# Patient Record
Sex: Female | Born: 1965 | Race: White | Hispanic: Yes | Marital: Married | Smoking: Never smoker
Health system: Southern US, Community
[De-identification: ages and names within clinical notes are randomized; demographics above are authoritative.]

## PROBLEM LIST (undated history)

## (undated) DIAGNOSIS — R630 Anorexia: Secondary | ICD-10-CM

## (undated) DIAGNOSIS — N200 Calculus of kidney: Secondary | ICD-10-CM

## (undated) HISTORY — PX: OTHER SURGICAL HISTORY: SHX169

## (undated) HISTORY — DX: Anorexia: R63.0

## (undated) HISTORY — DX: Calculus of kidney: N20.0

---

## 2003-11-08 ENCOUNTER — Ambulatory Visit: Payer: Self-pay | Admitting: Internal Medicine

## 2003-11-19 ENCOUNTER — Encounter: Admission: RE | Admit: 2003-11-19 | Discharge: 2003-11-19 | Payer: Self-pay | Admitting: Obstetrics & Gynecology

## 2004-11-06 ENCOUNTER — Ambulatory Visit: Payer: Self-pay | Admitting: Internal Medicine

## 2005-05-22 ENCOUNTER — Ambulatory Visit: Payer: Self-pay | Admitting: Internal Medicine

## 2006-03-02 ENCOUNTER — Ambulatory Visit: Payer: Self-pay | Admitting: Internal Medicine

## 2008-03-14 ENCOUNTER — Ambulatory Visit: Payer: Self-pay | Admitting: Internal Medicine

## 2008-03-14 LAB — CONVERTED CEMR LAB: Rapid Strep: NEGATIVE

## 2008-03-20 ENCOUNTER — Telehealth (INDEPENDENT_AMBULATORY_CARE_PROVIDER_SITE_OTHER): Payer: Self-pay | Admitting: *Deleted

## 2008-09-04 ENCOUNTER — Ambulatory Visit: Payer: Self-pay | Admitting: Internal Medicine

## 2008-12-20 ENCOUNTER — Telehealth: Payer: Self-pay | Admitting: Family Medicine

## 2009-07-22 ENCOUNTER — Encounter: Admission: RE | Admit: 2009-07-22 | Discharge: 2009-07-22 | Payer: Self-pay | Admitting: Obstetrics & Gynecology

## 2010-09-29 ENCOUNTER — Ambulatory Visit: Payer: Self-pay | Admitting: Internal Medicine

## 2010-10-07 ENCOUNTER — Ambulatory Visit (INDEPENDENT_AMBULATORY_CARE_PROVIDER_SITE_OTHER): Payer: BC Managed Care – PPO | Admitting: Internal Medicine

## 2010-10-07 ENCOUNTER — Encounter: Payer: Self-pay | Admitting: Internal Medicine

## 2010-10-07 DIAGNOSIS — Z0000 Encounter for general adult medical examination without abnormal findings: Secondary | ICD-10-CM | POA: Insufficient documentation

## 2010-10-07 DIAGNOSIS — Z111 Encounter for screening for respiratory tuberculosis: Secondary | ICD-10-CM

## 2010-10-07 NOTE — Assessment & Plan Note (Addendum)
Td -- 2006 cscope  ~ 1995 per pt , normal Gyn-- sees them regularly Labs-- just likes cholesterol checked, declined other labs  Doing very well, I recommend to continue with her healthy lifestyle. Form completed , will come back in 2 days for PPD reading (she will hold on to the form )

## 2010-10-07 NOTE — Progress Notes (Signed)
  Subjective:    Patient ID: Alyssa Morton, female    DOB: 05-27-1965, 45 y.o.   MRN: [id number]  HPI Next physical exam, doing well  Past Medical History  Diagnosis Date  . Anorexia     IN HIGH SCHOOL  . Kidney stones     HX OF   Past Surgical History  Procedure Date  . G2 p2    History   Social History  . Marital Status: Married    Spouse Name: N/A    Number of Children: 2  . Years of Education: N/A   Occupational History  . elementary teacher     Social History Main Topics  . Smoking status: Never Smoker   . Smokeless tobacco: Never Used  . Alcohol Use: Yes     rarely   . Drug Use: No  . Sexually Active: Not on file   Other Topics Concern  . Not on file   Social History Narrative   Diet: healthy-- exercise: routinely    Family History  Problem Relation Age of Onset  . Hypertension Maternal Grandfather   . Diabetes Neg Hx   . Coronary artery disease Neg Hx   . Colon cancer Neg Hx   . Breast cancer Neg Hx       Review of Systems  Respiratory: Negative for cough and shortness of breath.   Cardiovascular: Negative for chest pain and leg swelling.  Gastrointestinal: Negative for abdominal pain and blood in stool.  Genitourinary: Negative for dysuria and hematuria.       Objective:   Physical Exam  Constitutional: She is oriented to person, place, and time. She appears well-developed and well-nourished. No distress.  HENT:  Head: Normocephalic and atraumatic.  Neck: No thyromegaly present.  Cardiovascular: Normal rate, regular rhythm and normal heart sounds.   No murmur heard. Pulmonary/Chest: Effort normal and breath sounds normal. No respiratory distress. She has no wheezes. She has no rales.  Musculoskeletal: She exhibits no edema.  Neurological: She is alert and oriented to person, place, and time.  Skin: She is not diaphoretic.  Psychiatric: She has a normal mood and affect. Her behavior is normal. Judgment and thought content normal.           Assessment & Plan:

## 2010-10-07 NOTE — Patient Instructions (Signed)
Please came back fasting for  1. PPD reading 2. Labs : FLP--dx v70   (PT WILL WALKING FOR LABS AND PPD)

## 2010-10-10 ENCOUNTER — Other Ambulatory Visit (INDEPENDENT_AMBULATORY_CARE_PROVIDER_SITE_OTHER): Payer: BC Managed Care – PPO

## 2010-10-10 DIAGNOSIS — E785 Hyperlipidemia, unspecified: Secondary | ICD-10-CM

## 2010-10-10 LAB — LIPID PANEL
Cholesterol: 153 mg/dL (ref 0–200)
HDL: 60.6 mg/dL (ref >39.00)
LDL Cholesterol: 82 mg/dL (ref 0–99)
Total CHOL/HDL Ratio: 3
Triglycerides: 50 mg/dL (ref 0.0–149.0)
VLDL: 10 mg/dL (ref 0.0–40.0)

## 2010-10-10 LAB — TB SKIN TEST
Induration: 0
TB Skin Test: NEGATIVE mm

## 2011-06-09 ENCOUNTER — Other Ambulatory Visit: Payer: Self-pay | Admitting: Obstetrics & Gynecology

## 2011-06-09 DIAGNOSIS — Z1231 Encounter for screening mammogram for malignant neoplasm of breast: Secondary | ICD-10-CM

## 2011-06-26 ENCOUNTER — Ambulatory Visit
Admission: RE | Admit: 2011-06-26 | Discharge: 2011-06-26 | Disposition: A | Discharge status: Home / Self Care | Payer: BC Managed Care – PPO | Source: Ambulatory Visit | Attending: Obstetrics & Gynecology | Admitting: Obstetrics & Gynecology

## 2011-06-26 DIAGNOSIS — Z1231 Encounter for screening mammogram for malignant neoplasm of breast: Secondary | ICD-10-CM

## 2011-07-01 ENCOUNTER — Ambulatory Visit: Payer: BC Managed Care – PPO

## 2012-09-13 ENCOUNTER — Other Ambulatory Visit: Payer: Self-pay

## 2012-09-13 DIAGNOSIS — Z1231 Encounter for screening mammogram for malignant neoplasm of breast: Secondary | ICD-10-CM

## 2012-11-02 ENCOUNTER — Ambulatory Visit
Admission: RE | Admit: 2012-11-02 | Discharge: 2012-11-02 | Disposition: A | Discharge status: Home / Self Care | Payer: BC Managed Care – PPO | Source: Ambulatory Visit

## 2012-11-02 DIAGNOSIS — Z1231 Encounter for screening mammogram for malignant neoplasm of breast: Secondary | ICD-10-CM

## 2013-12-18 ENCOUNTER — Other Ambulatory Visit: Payer: Self-pay

## 2013-12-18 ENCOUNTER — Other Ambulatory Visit: Payer: Self-pay | Admitting: Obstetrics & Gynecology

## 2013-12-18 DIAGNOSIS — Z1231 Encounter for screening mammogram for malignant neoplasm of breast: Secondary | ICD-10-CM

## 2013-12-25 ENCOUNTER — Ambulatory Visit
Admission: RE | Admit: 2013-12-25 | Discharge: 2013-12-25 | Disposition: A | Discharge status: Home / Self Care | Payer: 59 | Source: Ambulatory Visit

## 2013-12-25 DIAGNOSIS — Z1231 Encounter for screening mammogram for malignant neoplasm of breast: Secondary | ICD-10-CM

## 2014-07-24 DIAGNOSIS — M7661 Achilles tendinitis, right leg: Secondary | ICD-10-CM | POA: Insufficient documentation

## 2014-12-25 LAB — HM PAP SMEAR

## 2015-01-01 ENCOUNTER — Other Ambulatory Visit: Payer: Self-pay

## 2015-01-01 DIAGNOSIS — Z1231 Encounter for screening mammogram for malignant neoplasm of breast: Secondary | ICD-10-CM

## 2015-01-24 ENCOUNTER — Ambulatory Visit
Admission: RE | Admit: 2015-01-24 | Discharge: 2015-01-24 | Disposition: A | Discharge status: Home / Self Care | Payer: 59 | Source: Ambulatory Visit

## 2015-01-24 DIAGNOSIS — Z1231 Encounter for screening mammogram for malignant neoplasm of breast: Secondary | ICD-10-CM

## 2015-09-20 ENCOUNTER — Ambulatory Visit (INDEPENDENT_AMBULATORY_CARE_PROVIDER_SITE_OTHER): Payer: Managed Care, Other (non HMO) | Admitting: Family Medicine

## 2015-09-20 ENCOUNTER — Encounter: Payer: Self-pay | Admitting: Family Medicine

## 2015-09-20 VITALS — BP 90/52 | HR 62 | Temp 97.6°F | Ht 63.75 in | Wt 132.8 lb

## 2015-09-20 DIAGNOSIS — Z0289 Encounter for other administrative examinations: Secondary | ICD-10-CM | POA: Diagnosis not present

## 2015-09-20 DIAGNOSIS — Z23 Encounter for immunization: Secondary | ICD-10-CM

## 2015-09-20 NOTE — Progress Notes (Signed)
Chief Complaint  Patient presents with  . Establish Care    pt need job form filled out       New Patient Visit SUBJECTIVE: HPI: Alyssa Morton is an 50 y.o.female who is being seen for establishing care.  She needs a form filled out for work. She is available body and works with schools. Her last tetanus shot was 9 years ago. She is up-to-date with her immunizations. She receives her women's health including Pap smear and mammogram through gynecology. She has no lifting restrictions are musculoskeletal ailments. She has no cardiac or pulmonary history denies shortness of breath or chest pain. No recent travel or history of TB exposure.  No Known Allergies  Past Medical History:  Diagnosis Date  . Anorexia    IN HIGH SCHOOL  . Kidney stones    HX OF   Past Surgical History:  Procedure Laterality Date  . G2 P2     Social History   Social History  . Marital status: Married  . Number of children: 2   Occupational History  . elementary teacher     Social History Main Topics  . Smoking status: Never Smoker  . Smokeless tobacco: Never Used  . Alcohol use Yes     Comment: rarely   . Drug use: No   Social History Narrative   Diet: healthy-- exercise: routinely    Family History  Problem Relation Age of Onset  . Hypertension Maternal Grandfather   . Diabetes Neg Hx   . Coronary artery disease Neg Hx   . Colon cancer Neg Hx   . Breast cancer Neg Hx    She takes no medications routinely.  ROS Cardiovascular: Denies chest pain or pressure, palpitations  Respiratory: Denies dyspnea, cough   OBJECTIVE: BP (!) 90/52 (BP Location: Left Arm, Patient Position: Sitting, Cuff Size: Normal)   Pulse 62   Temp 97.6 F (36.4 C) (Oral)   Ht 5' 3.75" (1.619 m)   Wt 132 lb 12.8 oz (60.2 kg)   LMP 09/08/2015 (Exact Date)   SpO2 99%   BMI 22.97 kg/m   Constitutional: -  VS reviewed -  Well developed, well nourished, appears stated age -  No apparent distress  Psychiatric:  -  Oriented to person, place, and time -  Memory intact -  Affect and mood normal -  Fluent conversation, good eye contact -  Judgment and insight age appropriate  Eye: -  Conjunctivae clear, no discharge -  Pupils symmetric, round, reactive to light  ENMT: -  Ears are patent b/l without erythema or discharge. TM's are shiny and clear b/l without evidence of effusion or infection. -  Oral mucosa without lesions, tongue and uvula midline    Tonsils not enlarged, no erythema, no exudate, trachea midline    Pharynx moist, no lesions, no erythema  Neck: -  No gross swelling, no palpable masses -  Thyroid midline, not enlarged, mobile, no palpable masses  Cardiovascular: -  RRR, no murmurs -  No LE edema  Respiratory: -  Normal respiratory effort, no accessory muscle use, no retraction -  Breath sounds equal, no wheezes, no ronchi, no crackles  Gastrointestinal: -  Bowel sounds normal -  No tenderness, no distention, no guarding, no masses  Neurological:  -  CN II - XII grossly intact -  Sensation grossly intact to light touch, equal bilaterally  Musculoskeletal: -  No clubbing, no cyanosis -  Gait normal -   5/5 strength throughout  Skin: -  No significant lesion on inspection -  Warm and dry to palpation   ASSESSMENT/PLAN: Encounter for completion of form with patient  Need for tuberculosis vaccination - Plan: PPD  Form filled out. No restrictions. She will pick up the completed form on Monday when her TB test as read Patient should return in 3 days and have her TB test read. Otherwise as needed. The patient voiced understanding and agreement to the plan.   Jilda Rocheicholas Paul BouseWendling, DO 09/20/15  4:57 PM

## 2015-09-20 NOTE — Progress Notes (Signed)
Pre visit review using our clinic review tool, if applicable. No additional management support is needed unless otherwise documented below in the visit note. 

## 2015-09-25 ENCOUNTER — Ambulatory Visit (INDEPENDENT_AMBULATORY_CARE_PROVIDER_SITE_OTHER): Payer: Managed Care, Other (non HMO)

## 2015-09-25 DIAGNOSIS — Z111 Encounter for screening for respiratory tuberculosis: Secondary | ICD-10-CM | POA: Diagnosis not present

## 2015-09-25 DIAGNOSIS — Z23 Encounter for immunization: Secondary | ICD-10-CM

## 2015-09-25 MED ORDER — TUBERCULIN PPD 5 UNIT/0.1ML ID SOLN: 0.1000 mL | Freq: Once | INTRADERMAL | 0 refills | Status: AC

## 2015-09-25 NOTE — Progress Notes (Signed)
Pre visit review using our clinic review tool, if applicable. No additional management support is needed unless otherwise documented below in the visit note.  TB skin test only

## 2015-09-27 LAB — TB SKIN TEST
Induration: 0 mm
TB Skin Test: NEGATIVE

## 2015-09-27 NOTE — Addendum Note (Signed)
Addended by: Vergie LivingBECKETT, Maynard David S on: 09/27/2015 04:17 PM   Modules accepted: Orders

## 2016-01-20 ENCOUNTER — Other Ambulatory Visit: Payer: Self-pay | Admitting: Obstetrics and Gynecology

## 2016-01-20 DIAGNOSIS — Z1231 Encounter for screening mammogram for malignant neoplasm of breast: Secondary | ICD-10-CM

## 2016-02-17 ENCOUNTER — Ambulatory Visit
Admission: RE | Admit: 2016-02-17 | Discharge: 2016-02-17 | Disposition: A | Discharge status: Home / Self Care | Payer: Managed Care, Other (non HMO) | Source: Ambulatory Visit | Attending: Obstetrics and Gynecology | Admitting: Obstetrics and Gynecology

## 2016-02-17 DIAGNOSIS — Z1231 Encounter for screening mammogram for malignant neoplasm of breast: Secondary | ICD-10-CM

## 2016-04-29 DIAGNOSIS — Z6823 Body mass index (BMI) 23.0-23.9, adult: Secondary | ICD-10-CM | POA: Diagnosis not present

## 2016-04-29 DIAGNOSIS — M7541 Impingement syndrome of right shoulder: Secondary | ICD-10-CM | POA: Diagnosis not present

## 2016-04-29 DIAGNOSIS — M7531 Calcific tendinitis of right shoulder: Secondary | ICD-10-CM | POA: Diagnosis not present

## 2016-05-13 DIAGNOSIS — Z01818 Encounter for other preprocedural examination: Secondary | ICD-10-CM | POA: Diagnosis not present

## 2016-05-13 DIAGNOSIS — Z1211 Encounter for screening for malignant neoplasm of colon: Secondary | ICD-10-CM | POA: Diagnosis not present

## 2016-05-19 LAB — HM COLONOSCOPY

## 2016-06-17 ENCOUNTER — Ambulatory Visit (INDEPENDENT_AMBULATORY_CARE_PROVIDER_SITE_OTHER): Payer: BLUE CROSS/BLUE SHIELD | Admitting: Family Medicine

## 2016-06-17 ENCOUNTER — Other Ambulatory Visit: Payer: BLUE CROSS/BLUE SHIELD

## 2016-06-17 ENCOUNTER — Telehealth: Payer: Self-pay | Admitting: Family Medicine

## 2016-06-17 ENCOUNTER — Ambulatory Visit (INDEPENDENT_AMBULATORY_CARE_PROVIDER_SITE_OTHER): Payer: BLUE CROSS/BLUE SHIELD

## 2016-06-17 DIAGNOSIS — Z23 Encounter for immunization: Secondary | ICD-10-CM | POA: Diagnosis not present

## 2016-06-17 DIAGNOSIS — Z0184 Encounter for antibody response examination: Secondary | ICD-10-CM

## 2016-06-17 LAB — MEASLES/MUMPS/RUBELLA IMMUNITY
Mumps IgG: 13.9 AU/mL — ABNORMAL HIGH (ref <9.00)
Rubella: 2.37 Index — ABNORMAL HIGH (ref <0.90)
Rubeola IgG: 57.8 AU/mL — ABNORMAL HIGH (ref <25.00)

## 2016-06-17 NOTE — Progress Notes (Signed)
Pre visit review using our clinic tool,if applicable. No additional management support is needed unless otherwise documented below in the visit note.   Patient in for Tdap and MMR titer  Tdap given IM left deltoid per patient request. No complaints voiced.

## 2016-06-17 NOTE — Telephone Encounter (Signed)
Caller name: Relationship to patient: Self Can be reached: [phone]  Pharmacy:  Reason for call: Request Varicella Titer, Hep B, and TB test (2 part)

## 2016-06-17 NOTE — Telephone Encounter (Signed)
OK. Given today.

## 2016-06-18 NOTE — Telephone Encounter (Signed)
[facility] [phone]) asking the pt to RTC regarding immunizations and form.//AB/CMA

## 2016-06-22 NOTE — Addendum Note (Signed)
Addended by: Verdie ShireBAYNES, ANGELA M on: 06/22/2016 11:31 AM   Modules accepted: Orders

## 2016-06-22 NOTE — Telephone Encounter (Signed)
Called and spoke with the pt on (Friday-06/19/16) and informed her that we will need to get a Varicella Titer and Hep B titer to check for immunity status on both.  Which is blood drawn.  Pt verbalized understanding and agreed.  Informed the pt that I will need to schedule her for a lab appt for the blood draw,and a nurse appt for the TB testing.  Also explained the 2 step TB skin test to the pt and she verbalized understanding.  Pt was scheduled the lab and nurse visit for (Tues-06/30/16 @ 9:00 and 9:30am).//AB/CMA

## 2016-06-24 DIAGNOSIS — Z1211 Encounter for screening for malignant neoplasm of colon: Secondary | ICD-10-CM | POA: Diagnosis not present

## 2016-06-30 ENCOUNTER — Other Ambulatory Visit: Payer: BLUE CROSS/BLUE SHIELD

## 2016-06-30 ENCOUNTER — Ambulatory Visit: Payer: BLUE CROSS/BLUE SHIELD

## 2016-06-30 DIAGNOSIS — Z0184 Encounter for antibody response examination: Secondary | ICD-10-CM

## 2016-06-30 DIAGNOSIS — Z111 Encounter for screening for respiratory tuberculosis: Secondary | ICD-10-CM

## 2016-06-30 LAB — HEPATITIS B SURFACE ANTIBODY, QUANTITATIVE: Hep B S AB Quant (Post): <5 m[IU]/mL

## 2016-06-30 NOTE — Progress Notes (Signed)
Pre visit review using our clinic tool,if applicable. No additional management support is needed unless otherwise documented below in the visit note.   Patient in for PPD placement. Given 0.1 ml (Mantoux) ID left forearm.  Patient scheduled to return on 07/02/16 to have read.

## 2016-07-01 LAB — VARICELLA ZOSTER ANTIBODY, IGG: Varicella IgG: 637.3 Index — ABNORMAL HIGH (ref <135.00)

## 2016-07-02 ENCOUNTER — Telehealth: Payer: Self-pay | Admitting: *Deleted

## 2016-07-02 ENCOUNTER — Ambulatory Visit: Payer: BLUE CROSS/BLUE SHIELD | Admitting: Behavioral Health

## 2016-07-02 DIAGNOSIS — Z111 Encounter for screening for respiratory tuberculosis: Secondary | ICD-10-CM

## 2016-07-02 LAB — READ PPD: TB Skin Test: NEGATIVE

## 2016-07-02 NOTE — Progress Notes (Signed)
Pre visit review using our clinic review tool, if applicable. No additional management support is needed unless otherwise documented below in the visit note.  Patient came in the clinic to have PPD read within the recommended 48-72 hours. Provider's order given per telephone note 06/17/16. PPD placed on 06/30/16; see clinical support note. Today's results were negative; 0 mm induration. No chest x-ray was required. Letter given to patient for school.

## 2016-07-02 NOTE — Telephone Encounter (Signed)
-----   Message from Sharlene DoryNicholas Paul Wendling, DO sent at 07/01/2016  1:16 PM EDT ----- It appears she is immune to varicella, but not to Hep B. Would recommend Hep B vaccination series now, in 2 mo and then 6 mo from original dose. TY.

## 2016-07-02 NOTE — Telephone Encounter (Addendum)
Pt came in the office today and was given her recent lab results and note.  Pt verbalized understanding and agreed.  Need order given the okay to give the Hep B series.  Please advise.//AB/CMA

## 2016-07-03 NOTE — Telephone Encounter (Signed)
OK 

## 2016-07-07 ENCOUNTER — Ambulatory Visit (INDEPENDENT_AMBULATORY_CARE_PROVIDER_SITE_OTHER): Payer: BLUE CROSS/BLUE SHIELD

## 2016-07-07 DIAGNOSIS — Z23 Encounter for immunization: Secondary | ICD-10-CM | POA: Diagnosis not present

## 2016-07-07 DIAGNOSIS — Z111 Encounter for screening for respiratory tuberculosis: Secondary | ICD-10-CM | POA: Diagnosis not present

## 2016-07-07 NOTE — Progress Notes (Signed)
Pre visit review using our clinic tool,if applicable. No additional management support is needed unless otherwise documented below in the visit note.   Patient in for 2nd PPD and 1st Hep B immunizations per order from Dr. Carmelia RollerWendling dated 07/02/16.  Hep B given in Left deltoid. PPD given in Right forearm.Patient tolerated well Appointment scheduled for PPD read and 2nd Hep B.    Patient made aware.

## 2016-07-09 ENCOUNTER — Ambulatory Visit: Payer: BLUE CROSS/BLUE SHIELD

## 2016-07-09 DIAGNOSIS — Z111 Encounter for screening for respiratory tuberculosis: Secondary | ICD-10-CM

## 2016-07-09 LAB — TB SKIN TEST: TB Skin Test: NEGATIVE

## 2016-07-09 NOTE — Progress Notes (Signed)
Patient in for PPD read. PPD negative. Form signed and dated. Given to patient.

## 2016-07-10 ENCOUNTER — Ambulatory Visit: Payer: Self-pay

## 2016-07-16 DIAGNOSIS — Z6823 Body mass index (BMI) 23.0-23.9, adult: Secondary | ICD-10-CM | POA: Diagnosis not present

## 2016-07-16 DIAGNOSIS — M7541 Impingement syndrome of right shoulder: Secondary | ICD-10-CM | POA: Diagnosis not present

## 2016-07-21 DIAGNOSIS — N390 Urinary tract infection, site not specified: Secondary | ICD-10-CM | POA: Diagnosis not present

## 2016-07-21 DIAGNOSIS — R300 Dysuria: Secondary | ICD-10-CM | POA: Diagnosis not present

## 2016-08-14 ENCOUNTER — Encounter: Payer: Self-pay | Admitting: Family Medicine

## 2016-08-14 NOTE — Telephone Encounter (Signed)
°  Relation to pt: self  Call back number: [phone]  Reason for call:  Patient would like to discuss vaccination and could not elaborate at the time due to her being in a public place, patient received a message from school regarding immunization and would like to speak with nurse,please advise

## 2016-08-14 NOTE — Telephone Encounter (Signed)
Pt called in to request another call back from assistant before the weekend.    Please call back at : [phone]

## 2016-08-14 NOTE — Telephone Encounter (Signed)
error:[id number] ° °

## 2016-08-17 ENCOUNTER — Encounter: Payer: Self-pay | Admitting: Family Medicine

## 2016-08-17 ENCOUNTER — Ambulatory Visit (INDEPENDENT_AMBULATORY_CARE_PROVIDER_SITE_OTHER): Payer: BLUE CROSS/BLUE SHIELD | Admitting: Family Medicine

## 2016-08-17 VITALS — BP 98/64 | HR 61 | Temp 98.1°F | Ht 63.75 in | Wt 127.8 lb

## 2016-08-17 DIAGNOSIS — Z7189 Other specified counseling: Secondary | ICD-10-CM

## 2016-08-17 DIAGNOSIS — Z7185 Encounter for immunization safety counseling: Secondary | ICD-10-CM

## 2016-08-17 NOTE — Telephone Encounter (Signed)
Pt was seen in the office today.//AB/CMA

## 2016-08-17 NOTE — Progress Notes (Signed)
Chief Complaint  Patient presents with  . Pt needing vaccinations    for school.    Subjective: Patient is a 51 y.o. female here for vaccinations.  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 She hopefully starts this September. She has a form to fill out regarding her vaccinations. We did a hepatitis B titer that showed no levels of immunity. She is undergoing revaccination and received the first dose in June and her next dose is scheduled for early Sept. She feels well otherwise. She is physically active and exercises routinely.   ROS: Const: no fevers  Family History  Problem Relation Age of Onset  . Hypertension Maternal Grandfather   . Diabetes Neg Hx   . Coronary artery disease Neg Hx   . Colon cancer Neg Hx   . Breast cancer Neg Hx    Past Medical History:  Diagnosis Date  . Anorexia    IN HIGH SCHOOL  . Kidney stones    HX OF   No Known Allergies   She takes no medications routinely.  Objective: BP 98/64 (BP Location: Left Arm, Patient Position: Sitting, Cuff Size: Normal)   Pulse 61   Temp 98.1 F (36.7 C) (Oral)   Ht 5' 3.75" (1.619 m)   Wt 127 lb 12.8 oz (58 kg)   LMP 07/17/2016 (Approximate)   SpO2 99%   BMI 22.11 kg/m  General: Awake, appears stated age Lungs: No accessory muscle use Psych: Age appropriate judgment and insight, normal affect and mood  Assessment and Plan: Immunization counseling  Letter written discussing her immunizations and compliance with our recs. I do not think that her lack of immunogenicity will affect her safety or ability to participate in her clinicals.  The patient voiced understanding and agreement to the plan.  Greater than 13 minutes were spent face to face with the patient with greater than 50% of this time spent counseling on vaccination schedules and immunity.  Jilda Rocheicholas Paul [place], DO 08/17/16  2:43 PM

## 2016-08-17 NOTE — Patient Instructions (Addendum)
Let me know if you need a specific letter for your schooling.

## 2016-09-11 ENCOUNTER — Ambulatory Visit (INDEPENDENT_AMBULATORY_CARE_PROVIDER_SITE_OTHER): Payer: BLUE CROSS/BLUE SHIELD | Admitting: Behavioral Health

## 2016-09-11 DIAGNOSIS — Z23 Encounter for immunization: Secondary | ICD-10-CM

## 2016-09-11 NOTE — Progress Notes (Signed)
Pre visit review using our clinic review tool, if applicable. No additional management support is needed unless otherwise documented below in the visit note.  Patient came in clinic for 2nd Hepatitis B vaccination. IM injection was given in the left deltoid. Patient tolerated injection well. Next appointment 01/08/17 at 9:00 AM.

## 2016-10-05 DIAGNOSIS — Z23 Encounter for immunization: Secondary | ICD-10-CM | POA: Diagnosis not present

## 2016-10-16 ENCOUNTER — Ambulatory Visit: Payer: Self-pay

## 2016-11-30 DIAGNOSIS — M25511 Pain in right shoulder: Secondary | ICD-10-CM | POA: Diagnosis not present

## 2016-11-30 DIAGNOSIS — M7531 Calcific tendinitis of right shoulder: Secondary | ICD-10-CM | POA: Diagnosis not present

## 2017-01-08 ENCOUNTER — Ambulatory Visit: Payer: Self-pay

## 2017-01-12 ENCOUNTER — Ambulatory Visit (INDEPENDENT_AMBULATORY_CARE_PROVIDER_SITE_OTHER): Payer: BLUE CROSS/BLUE SHIELD

## 2017-01-12 DIAGNOSIS — Z23 Encounter for immunization: Secondary | ICD-10-CM | POA: Diagnosis not present

## 2017-03-24 DIAGNOSIS — J029 Acute pharyngitis, unspecified: Secondary | ICD-10-CM | POA: Diagnosis not present

## 2017-04-28 ENCOUNTER — Telehealth: Payer: Self-pay | Admitting: Family Medicine

## 2017-04-28 NOTE — Telephone Encounter (Signed)
f °

## 2017-05-21 ENCOUNTER — Other Ambulatory Visit: Payer: Self-pay | Admitting: Family Medicine

## 2017-05-21 DIAGNOSIS — Z1231 Encounter for screening mammogram for malignant neoplasm of breast: Secondary | ICD-10-CM

## 2017-05-24 ENCOUNTER — Encounter: Payer: Self-pay | Admitting: Family Medicine

## 2017-05-27 ENCOUNTER — Telehealth: Payer: Self-pay | Admitting: Family Medicine

## 2017-05-27 NOTE — Telephone Encounter (Signed)
Copied from CRM [id number]. Topic: Quick Communication - See Telephone Encounter >> May 27, 2017  4:28 PM Rudi Coco, NT wrote: CRM for notification. See Telephone encounter for: 05/27/17.  Pt. Calling to check the status of want ing to schedule a Hep B vacc.

## 2017-06-01 ENCOUNTER — Other Ambulatory Visit: Payer: Self-pay | Admitting: Family Medicine

## 2017-06-01 ENCOUNTER — Other Ambulatory Visit (INDEPENDENT_AMBULATORY_CARE_PROVIDER_SITE_OTHER): Payer: BLUE CROSS/BLUE SHIELD

## 2017-06-01 DIAGNOSIS — Z0184 Encounter for antibody response examination: Secondary | ICD-10-CM

## 2017-06-01 LAB — HEPATITIS B SURFACE ANTIBODY,QUALITATIVE: Hep B S Ab: REACTIVE — AB

## 2017-06-01 NOTE — Telephone Encounter (Signed)
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 She had a titer/and has had the 3 Hep B----The school is now requesting a titer

## 2017-06-01 NOTE — Telephone Encounter (Signed)
OK to order titer. TY.

## 2017-06-01 NOTE — Telephone Encounter (Signed)
Advise had already on 07/07/2016--09/11/2016---and 01/12/2017

## 2017-06-01 NOTE — Telephone Encounter (Signed)
Put in order/called the patient to schedule appointment in the lab

## 2017-06-01 NOTE — Telephone Encounter (Signed)
It is a 3 shot series. What specifically does she need?

## 2017-06-11 ENCOUNTER — Ambulatory Visit
Admission: RE | Admit: 2017-06-11 | Discharge: 2017-06-11 | Disposition: A | Discharge status: Home / Self Care | Payer: BLUE CROSS/BLUE SHIELD | Source: Ambulatory Visit | Attending: Family Medicine | Admitting: Family Medicine

## 2017-06-11 DIAGNOSIS — Z1231 Encounter for screening mammogram for malignant neoplasm of breast: Secondary | ICD-10-CM

## 2017-06-15 ENCOUNTER — Encounter: Payer: Self-pay | Admitting: Family Medicine

## 2017-06-23 ENCOUNTER — Telehealth: Payer: Self-pay | Admitting: *Deleted

## 2017-06-23 DIAGNOSIS — Z0184 Encounter for antibody response examination: Secondary | ICD-10-CM

## 2017-06-23 DIAGNOSIS — Z111 Encounter for screening for respiratory tuberculosis: Secondary | ICD-10-CM

## 2017-06-23 NOTE — Telephone Encounter (Signed)
Patient called and she needs for her paperwork a numerical value for Hep B and back in May we did only yes or no.  So it looks like she needs a quant (shows numerical value).  She also needing TB test, which she can do with blood work.    She does not want to have to do another test for hep B because she thought the one in May was the right one and she should not have to pay for another test.  This is the third test in a year.    I have pended the tests that she needs, but is there anyway to help her out with the hep b since the wrong test was ordered.

## 2017-06-23 NOTE — Telephone Encounter (Signed)
Orders placed. Looks like first one was not accepted because she needed a quant, not just documentation of immunity. Help for the remedy? TY.

## 2017-06-24 NOTE — Telephone Encounter (Signed)
Scheduled the lab appt. The patient has 2 questions? Will she have to pay for another titer she states since the previous was not ordered correctly? Also what would be the cost/compare of doing the TB blood test verses coming in to have done as TB skin test? Her school does require the two stepTB skin test or blood test. She does want to confirm that test ordered are correct?

## 2017-07-02 NOTE — Telephone Encounter (Signed)
I don't know the answers to these questions. I would guess the quant gold blood test is more expensive. TY.

## 2017-07-02 NOTE — Telephone Encounter (Signed)
Have had no response from this patients questions.  Know she will be calling back soon--advise on all if possible asap. Some questions to do with cost and I am not aware of this knowledge.

## 2017-07-26 ENCOUNTER — Other Ambulatory Visit: Payer: BLUE CROSS/BLUE SHIELD

## 2017-07-26 ENCOUNTER — Other Ambulatory Visit (INDEPENDENT_AMBULATORY_CARE_PROVIDER_SITE_OTHER): Payer: BLUE CROSS/BLUE SHIELD

## 2017-07-26 DIAGNOSIS — Z111 Encounter for screening for respiratory tuberculosis: Secondary | ICD-10-CM

## 2017-07-26 DIAGNOSIS — Z0184 Encounter for antibody response examination: Secondary | ICD-10-CM | POA: Diagnosis not present

## 2017-07-26 NOTE — Addendum Note (Signed)
Addended by: Harley AltoPRICE, Fonda Rochon M on: 07/26/2017 02:19 PM   Modules accepted: Orders

## 2017-07-27 LAB — HEPATITIS B SURFACE ANTIBODY, QUANTITATIVE: Hepatitis B-Post: 8 m[IU]/mL — ABNORMAL LOW (ref >=10)

## 2017-07-28 ENCOUNTER — Other Ambulatory Visit: Payer: Self-pay | Admitting: Family Medicine

## 2017-07-28 ENCOUNTER — Ambulatory Visit (INDEPENDENT_AMBULATORY_CARE_PROVIDER_SITE_OTHER): Payer: BLUE CROSS/BLUE SHIELD

## 2017-07-28 DIAGNOSIS — Z0184 Encounter for antibody response examination: Secondary | ICD-10-CM

## 2017-07-28 DIAGNOSIS — Z23 Encounter for immunization: Secondary | ICD-10-CM

## 2017-07-28 LAB — QUANTIFERON-TB GOLD PLUS
Mitogen-NIL: >10 IU/mL
NIL: 0.33 IU/mL
QuantiFERON-TB Gold Plus: NEGATIVE
TB2-NIL: <0 IU/mL

## 2017-09-10 ENCOUNTER — Other Ambulatory Visit (INDEPENDENT_AMBULATORY_CARE_PROVIDER_SITE_OTHER): Payer: BLUE CROSS/BLUE SHIELD

## 2017-09-10 DIAGNOSIS — Z0184 Encounter for antibody response examination: Secondary | ICD-10-CM | POA: Diagnosis not present

## 2017-09-11 LAB — HEPATITIS B SURFACE ANTIBODY,QUALITATIVE: Hep B S Ab: REACTIVE — AB

## 2017-09-15 DIAGNOSIS — Z23 Encounter for immunization: Secondary | ICD-10-CM | POA: Diagnosis not present

## 2017-09-22 ENCOUNTER — Telehealth: Payer: Self-pay | Admitting: Family Medicine

## 2017-09-22 NOTE — Telephone Encounter (Addendum)
Copied from CRM [id number]. Topic: Quick Communication - See Telephone Encounter >> Sep 22, 2017  4:37 PM Trula SladeWalter, Linda F wrote: CRM for notification. See Telephone encounter for: 09/22/17. Patient decided to send a message to the practice administrator instead.

## 2017-09-23 ENCOUNTER — Telehealth: Payer: Self-pay | Admitting: Family Medicine

## 2017-09-23 NOTE — Telephone Encounter (Signed)
The test has been drawn and resulted so we would have to redraw her. I did get Epic to label each test in the computer, that was the error the fist time(both test were in the computer as Hepatitis B). Now it does say Hep B quan, or Hep B qual. Not sure what went wrong.

## 2017-09-23 NOTE — Telephone Encounter (Signed)
Copied from CRM #[id number]. Topic: Complaint - Staff >> Sep 22, 2017  4:43 PM Walter, Linda F wrote: Patient has had two blood draws that has come back wrong.  They should have been QUANTITATIVE but instead both were QUALITATIVE. She is now going to try and go to someone else because she feels the practice don't know how to get this correct.  Now the results are going to be too late for her deadline and she has to talk to her place of employment.  She would like a return call from the practice manager.   Route to Practice Administrator. 

## 2017-09-23 NOTE — Telephone Encounter (Signed)
Copied from CRM [id number]. Topic: Complaint - Staff >> Sep 22, 2017  4:43 PM Trula SladeWalter, Linda F wrote: Patient has had two blood draws that has come back wrong.  They should have been QUANTITATIVE but instead both were QUALITATIVE. She is now going to try and go to someone else because she feels the practice don't know how to get this correct.  Now the results are going to be too late for her deadline and she has to talk to her place of employment.  She would like a return call from the Engineer, manufacturingpractice manager.   Route to Research officer, political partyractice Administrator.

## 2017-09-24 NOTE — Telephone Encounter (Signed)
[place], please look into us covering her labwork schedule for 9/23, as apparently this error has occurred twice. Author told pt. We would do our best to look into covering the cost for her.

## 2017-09-24 NOTE — Telephone Encounter (Signed)
Author phoned pt. to review issue with incorrect kind of lab draw for hep b titer . Pt. needs to have lab redrawn for quantitative, as she needs numbers to show her employer by 10/7 or she will not be able to return to work. Author routing to Dr. Carmelia RollerWendling, Wilkie AyeKristy, and [place], in hopes of finding the correct code so we can ensure accurate ordering and kind of result. Lab appointment made for 9/23 at 130PM, as that is the only time pt. Has available.

## 2017-09-27 ENCOUNTER — Other Ambulatory Visit: Payer: BLUE CROSS/BLUE SHIELD

## 2017-09-27 ENCOUNTER — Other Ambulatory Visit: Payer: Self-pay | Admitting: Family Medicine

## 2017-09-27 DIAGNOSIS — Z0184 Encounter for antibody response examination: Secondary | ICD-10-CM

## 2017-09-27 NOTE — Progress Notes (Signed)
Pt. Already seen in lab, lab technicians made aware prior to pt. Arrival and Wilkie AyeKristy confirmed with quest the code needed for quantitative hep B. Awaiting results.

## 2017-09-27 NOTE — Progress Notes (Unsigned)
I think we want quantitative hep b surface antibody to see the titer. Can you please verify, Angie?

## 2017-09-28 LAB — HEPATITIS B SURFACE ANTIBODY, QUANTITATIVE: HEPATITIS B-POST: 18 m[IU]/mL (ref >=10)

## 2018-02-04 DIAGNOSIS — M79671 Pain in right foot: Secondary | ICD-10-CM | POA: Diagnosis not present

## 2018-02-04 DIAGNOSIS — M19071 Primary osteoarthritis, right ankle and foot: Secondary | ICD-10-CM | POA: Diagnosis not present

## 2018-05-31 ENCOUNTER — Other Ambulatory Visit: Payer: Self-pay | Admitting: Family Medicine

## 2018-05-31 DIAGNOSIS — Z9289 Personal history of other medical treatment: Secondary | ICD-10-CM

## 2018-06-02 ENCOUNTER — Ambulatory Visit: Payer: Self-pay

## 2018-06-03 ENCOUNTER — Ambulatory Visit (INDEPENDENT_AMBULATORY_CARE_PROVIDER_SITE_OTHER): Payer: BLUE CROSS/BLUE SHIELD

## 2018-06-03 ENCOUNTER — Other Ambulatory Visit: Payer: Self-pay

## 2018-06-03 DIAGNOSIS — Z111 Encounter for screening for respiratory tuberculosis: Secondary | ICD-10-CM | POA: Diagnosis not present

## 2018-06-03 NOTE — Progress Notes (Signed)
Noted. Agree with above.  Nicholas Paul Wendling, DO 06/03/18 10:56 AM   

## 2018-06-03 NOTE — Progress Notes (Signed)
Pt here today for PPD placement. Pt verbally confirmed that she has never had a positive PPD skin test before.   0.2mL Tubersol injected into L forearm. Pt tolerated injection well. Pt to come back on Monday 06/06/2018 to have PPD skin test read.

## 2018-06-06 LAB — TB SKIN TEST: TB Skin Test: NEGATIVE

## 2018-07-22 ENCOUNTER — Ambulatory Visit
Admission: RE | Admit: 2018-07-22 | Discharge: 2018-07-22 | Disposition: A | Discharge status: Home / Self Care | Payer: BC Managed Care – PPO | Source: Ambulatory Visit | Attending: Family Medicine | Admitting: Family Medicine

## 2018-07-22 ENCOUNTER — Other Ambulatory Visit: Payer: Self-pay

## 2018-07-22 DIAGNOSIS — Z1231 Encounter for screening mammogram for malignant neoplasm of breast: Secondary | ICD-10-CM | POA: Diagnosis not present

## 2018-07-22 DIAGNOSIS — Z9289 Personal history of other medical treatment: Secondary | ICD-10-CM

## 2018-08-19 DIAGNOSIS — Z20828 Contact with and (suspected) exposure to other viral communicable diseases: Secondary | ICD-10-CM | POA: Diagnosis not present

## 2018-08-19 DIAGNOSIS — Z6821 Body mass index (BMI) 21.0-21.9, adult: Secondary | ICD-10-CM | POA: Diagnosis not present

## 2018-10-28 ENCOUNTER — Telehealth: Payer: Self-pay | Admitting: Family Medicine

## 2018-10-28 NOTE — Telephone Encounter (Signed)
Copied from [place] [id number]. Topic: General - Other >> Oct 28, 2018 10:05 AM Celene Kras A wrote: Reason for CRM: Pt called and is requesting to have a TB test done. Please advise.

## 2018-10-28 NOTE — Telephone Encounter (Signed)
That's fine, she probably needs to schedule her CPE as we haven't seen her in a while. Ty.

## 2018-10-28 NOTE — Telephone Encounter (Signed)
Called the patient and she went ahead to an UC to do the TB test. She does not need an appt at this time but may call back later to schedule.

## 2018-11-04 DIAGNOSIS — M25512 Pain in left shoulder: Secondary | ICD-10-CM | POA: Diagnosis not present

## 2019-02-16 DIAGNOSIS — N951 Menopausal and female climacteric states: Secondary | ICD-10-CM | POA: Diagnosis not present

## 2019-02-16 DIAGNOSIS — Z124 Encounter for screening for malignant neoplasm of cervix: Secondary | ICD-10-CM | POA: Diagnosis not present

## 2019-02-16 DIAGNOSIS — Z01419 Encounter for gynecological examination (general) (routine) without abnormal findings: Secondary | ICD-10-CM | POA: Diagnosis not present

## 2019-02-16 DIAGNOSIS — R4189 Other symptoms and signs involving cognitive functions and awareness: Secondary | ICD-10-CM | POA: Diagnosis not present

## 2019-02-16 DIAGNOSIS — Z1151 Encounter for screening for human papillomavirus (HPV): Secondary | ICD-10-CM | POA: Diagnosis not present

## 2019-03-31 DIAGNOSIS — D225 Melanocytic nevi of trunk: Secondary | ICD-10-CM | POA: Diagnosis not present

## 2019-03-31 DIAGNOSIS — D485 Neoplasm of uncertain behavior of skin: Secondary | ICD-10-CM | POA: Diagnosis not present

## 2019-03-31 DIAGNOSIS — Z1283 Encounter for screening for malignant neoplasm of skin: Secondary | ICD-10-CM | POA: Diagnosis not present

## 2019-05-04 DIAGNOSIS — L255 Unspecified contact dermatitis due to plants, except food: Secondary | ICD-10-CM | POA: Diagnosis not present

## 2019-06-30 ENCOUNTER — Ambulatory Visit (INDEPENDENT_AMBULATORY_CARE_PROVIDER_SITE_OTHER): Payer: BC Managed Care – PPO | Admitting: Family Medicine

## 2019-06-30 ENCOUNTER — Encounter: Payer: Self-pay | Admitting: Family Medicine

## 2019-06-30 ENCOUNTER — Other Ambulatory Visit: Payer: Self-pay

## 2019-06-30 VITALS — BP 101/68 | HR 76 | Temp 97.2°F | Resp 18 | Ht 63.75 in

## 2019-06-30 DIAGNOSIS — Z0000 Encounter for general adult medical examination without abnormal findings: Secondary | ICD-10-CM

## 2019-06-30 DIAGNOSIS — Z1159 Encounter for screening for other viral diseases: Secondary | ICD-10-CM

## 2019-06-30 LAB — COMPREHENSIVE METABOLIC PANEL
ALT: 11 U/L (ref 0–35)
AST: 20 U/L (ref 0–37)
Albumin: 4.5 g/dL (ref 3.5–5.2)
Alkaline Phosphatase: 79 U/L (ref 39–117)
BUN: 21 mg/dL (ref 6–23)
CO2: 29 mEq/L (ref 19–32)
Calcium: 9.5 mg/dL (ref 8.4–10.5)
Chloride: 104 mEq/L (ref 96–112)
Creatinine, Ser: 0.79 mg/dL (ref 0.40–1.20)
GFR: 76.09 mL/min (ref >60.00)
Glucose, Bld: 75 mg/dL (ref 70–99)
Potassium: 4.5 mEq/L (ref 3.5–5.1)
Sodium: 140 mEq/L (ref 135–145)
Total Bilirubin: 0.5 mg/dL (ref 0.2–1.2)
Total Protein: 6.5 g/dL (ref 6.0–8.3)

## 2019-06-30 LAB — LIPID PANEL
Cholesterol: 194 mg/dL (ref 0–200)
HDL: 69 mg/dL (ref >39.00)
LDL Cholesterol: 115 mg/dL — ABNORMAL HIGH (ref 0–99)
NonHDL: 125.38
Total CHOL/HDL Ratio: 3
Triglycerides: 51 mg/dL (ref 0.0–149.0)
VLDL: 10.2 mg/dL (ref 0.0–40.0)

## 2019-06-30 LAB — CBC
HCT: 41.1 % (ref 36.0–46.0)
Hemoglobin: 13.8 g/dL (ref 12.0–15.0)
MCHC: 33.7 g/dL (ref 30.0–36.0)
MCV: 89.6 fl (ref 78.0–100.0)
Platelets: 230 10*3/uL (ref 150.0–400.0)
RBC: 4.58 Mil/uL (ref 3.87–5.11)
RDW: 13.4 % (ref 11.5–15.5)
WBC: 3.3 10*3/uL — ABNORMAL LOW (ref 4.0–10.5)

## 2019-06-30 NOTE — Progress Notes (Signed)
Chief Complaint  Patient presents with  . Annual Exam    Non-fasting labs     Well Woman Alyssa Morton is here for a complete physical.   Her last physical was >1 year ago.  Current diet: in general, a "healthy" diet. Current exercise: wt resistance exercise, cardio. Weight is stable and she denies fatigue out of ordinary. Seatbelt? Yes  Health Maintenance Pap/HPV- Yes Mammogram- Yes Colon cancer screening-Yes Shingrix- No Tetanus- Yes Hep C screening- No HIV screening- Yes  Past Medical History:  Diagnosis Date  . Anorexia    IN HIGH SCHOOL  . Kidney stones    HX OF     Past Surgical History:  Procedure Laterality Date  . G2 P2      Medications  Current Outpatient Medications on File Prior to Visit  Medication Sig Dispense Refill  . Diclofenac Sodium (VOLTAREN EX) Apply topically as needed.      Allergies No Known Allergies  Review of Systems: Constitutional:  no unexpected weight changes Eye:  no recent significant change in vision Ear/Nose/Mouth/Throat:  Ears:  no recent change in hearing Nose/Mouth/Throat:  no complaints of nasal congestion, no sore throat Cardiovascular: no chest pain Respiratory:  no shortness of breath Gastrointestinal:  no abdominal pain, no change in bowel habits GU:  Female: negative for dysuria or pelvic pain Musculoskeletal/Extremities: +R 1st MTP pain intermittently; otherwise no pain of the joints Integumentary (Skin/Breast):  no abnormal skin lesions reported Neurologic:  no headaches Endocrine:  denies fatigue  Exam BP 101/68   Pulse 76   Temp (!) 97.2 F (36.2 C) (Temporal)   Resp 18   Ht 5' 3.75" (1.619 m)   SpO2 100%   BMI 22.11 kg/m  General:  well developed, well nourished, in no apparent distress Skin:  no significant moles, warts, or growths Head:  no masses, lesions, or tenderness Eyes:  pupils equal and round, sclera anicteric without injection Ears:  canals without lesions, TMs shiny without  retraction, no obvious effusion, no erythema Nose:  nares patent, septum midline, mucosa normal, and no drainage or sinus tenderness Throat/Pharynx:  lips and gingiva without lesion; tongue and uvula midline; non-inflamed pharynx; no exudates or postnasal drainage Neck: neck supple without adenopathy, thyromegaly, or masses Lungs:  clear to auscultation, breath sounds equal bilaterally, no respiratory distress Cardio:  regular rate and rhythm, no LE edema Abdomen:  abdomen soft, nontender; bowel sounds normal; no masses or organomegaly Genital: Defer to GYN Musculoskeletal: ttp over medial dorsal portion of 1st MT; otherwise symmetrical muscle groups noted without atrophy or deformity Extremities:  no clubbing, cyanosis, or edema, no deformities, no skin discoloration Neuro:  gait normal; deep tendon reflexes normal and symmetric Psych: well oriented with normal range of affect and appropriate judgment/insight  Assessment and Plan  Well adult exam - Plan: CBC, Lipid panel, Comprehensive metabolic panel  Encounter for hepatitis C screening test for low risk patient - Plan: Hepatitis C antibody   Well 54 y.o. female. Counseled on diet and exercise. Other orders as above. Follow up in 1 yr or prn. The patient voiced understanding and agreement to the plan.  Jilda Roche [place], DO 06/30/19 11:45 AM

## 2019-06-30 NOTE — Patient Instructions (Addendum)

## 2019-07-03 ENCOUNTER — Other Ambulatory Visit: Payer: Self-pay | Admitting: Family Medicine

## 2019-07-03 DIAGNOSIS — E785 Hyperlipidemia, unspecified: Secondary | ICD-10-CM

## 2019-07-03 LAB — HEPATITIS C ANTIBODY
Hepatitis C Ab: NONREACTIVE
SIGNAL TO CUT-OFF: 0.01 (ref <1.00)

## 2019-07-03 NOTE — Progress Notes (Signed)
Lipid panel 

## 2019-07-06 ENCOUNTER — Other Ambulatory Visit: Payer: Self-pay | Admitting: Family Medicine

## 2019-07-06 DIAGNOSIS — Z1231 Encounter for screening mammogram for malignant neoplasm of breast: Secondary | ICD-10-CM

## 2019-07-28 ENCOUNTER — Ambulatory Visit: Payer: BC Managed Care – PPO

## 2019-07-28 ENCOUNTER — Other Ambulatory Visit: Payer: Self-pay

## 2019-07-28 ENCOUNTER — Ambulatory Visit
Admission: RE | Admit: 2019-07-28 | Discharge: 2019-07-28 | Disposition: A | Discharge status: Home / Self Care | Payer: BC Managed Care – PPO | Source: Ambulatory Visit | Attending: Family Medicine | Admitting: Family Medicine

## 2019-07-28 DIAGNOSIS — Z1231 Encounter for screening mammogram for malignant neoplasm of breast: Secondary | ICD-10-CM

## 2019-09-08 ENCOUNTER — Encounter: Payer: Self-pay | Admitting: Family Medicine

## 2019-09-08 ENCOUNTER — Other Ambulatory Visit: Payer: Self-pay

## 2019-09-08 ENCOUNTER — Ambulatory Visit: Payer: BC Managed Care – PPO | Admitting: Nurse Practitioner

## 2019-09-08 ENCOUNTER — Ambulatory Visit: Payer: BC Managed Care – PPO | Admitting: Family Medicine

## 2019-09-08 VITALS — BP 108/62 | HR 61 | Temp 97.5°F

## 2019-09-08 DIAGNOSIS — R21 Rash and other nonspecific skin eruption: Secondary | ICD-10-CM

## 2019-09-08 MED ORDER — TRIAMCINOLONE ACETONIDE 0.1 % EX CREA: TOPICAL_CREAM | CUTANEOUS | 0 refills | Status: DC

## 2019-09-08 NOTE — Progress Notes (Signed)
Chief Complaint  Patient presents with   Rash    Alyssa Morton is a 54 y.o. female here for a skin complaint.  Duration: 6 days  Location: arms and neck/face.  Pruritic? Yes Painful? No Drainage? No New soaps/lotions/topicals/detergents? Used sunscreen prior to golfing Sick contacts? No Other associated symptoms: some bumps; no sob, swelling, new meds Therapies tried thus far: None  Past Medical History:  Diagnosis Date   Anorexia    IN HIGH SCHOOL   Kidney stones    HX OF    BP 108/62 (BP Location: Left Arm, Patient Position: Sitting, Cuff Size: Normal)    Pulse 61    Temp (!) 97.5 F (36.4 C) (Oral)    SpO2 98%  Gen: awake, alert, appearing stated age Lungs: No accessory muscle use Skin: Flesh colored papules noted on UE's and face; these are also noted over platysma b/l with some erythema and slight scaling. . No drainage, excessive warmth, TTP, fluctuance, excoriation Psych: Age appropriate judgment and insight  Rash - Plan: triamcinolone cream (KENALOG) 0.1 %  Cream for under neck. 1% OTC HC cream for face as needed. I think her arms are getting better, consider PO antihistamine. Use moisturizer. Pt had pic that showed welts on arms suggesting allergic process.  F/u prn. The patient voiced understanding and agreement to the plan.  [name], DO 09/08/19 3:35 PM

## 2019-09-08 NOTE — Patient Instructions (Signed)
Claritin (loratadine), Allegra (fexofenadine), Zyrtec (cetirizine) which is also equivalent to Xyzal (levocetirizine); these are listed in order from weakest to strongest. Generic, and therefore cheaper, options are in the parentheses.   There are available OTC, and the generic versions, which may be cheaper, are in parentheses. Show this to a pharmacist if you have trouble finding any of these items.  Try not to itch.  Consider 1% hydrocortisone cream for your face if it becomes irritated/itchy like your neck.  Use a good moisturizer free of scent.  Ice/cold pack over area for 10-15 min twice daily for itching relief.  Let us know if you need anything.

## 2019-10-11 DIAGNOSIS — L562 Photocontact dermatitis [berloque dermatitis]: Secondary | ICD-10-CM | POA: Diagnosis not present

## 2019-10-11 DIAGNOSIS — I878 Other specified disorders of veins: Secondary | ICD-10-CM | POA: Diagnosis not present

## 2019-10-11 DIAGNOSIS — D485 Neoplasm of uncertain behavior of skin: Secondary | ICD-10-CM | POA: Diagnosis not present

## 2020-01-01 DIAGNOSIS — Z20822 Contact with and (suspected) exposure to covid-19: Secondary | ICD-10-CM | POA: Diagnosis not present

## 2020-01-03 DIAGNOSIS — Z20822 Contact with and (suspected) exposure to covid-19: Secondary | ICD-10-CM | POA: Diagnosis not present

## 2020-02-22 DIAGNOSIS — M19071 Primary osteoarthritis, right ankle and foot: Secondary | ICD-10-CM | POA: Diagnosis not present

## 2020-02-22 DIAGNOSIS — M79672 Pain in left foot: Secondary | ICD-10-CM | POA: Diagnosis not present

## 2020-02-22 DIAGNOSIS — M25572 Pain in left ankle and joints of left foot: Secondary | ICD-10-CM | POA: Diagnosis not present

## 2020-03-08 DIAGNOSIS — Z01419 Encounter for gynecological examination (general) (routine) without abnormal findings: Secondary | ICD-10-CM | POA: Diagnosis not present

## 2020-03-30 DIAGNOSIS — R5383 Other fatigue: Secondary | ICD-10-CM | POA: Diagnosis not present

## 2020-03-30 DIAGNOSIS — R519 Headache, unspecified: Secondary | ICD-10-CM | POA: Diagnosis not present

## 2020-04-01 DIAGNOSIS — J029 Acute pharyngitis, unspecified: Secondary | ICD-10-CM | POA: Diagnosis not present

## 2020-06-06 NOTE — Progress Notes (Signed)
Subjective:    CC: R elbow pain  I, Alyssa Morton, LAT, ATC, am serving as scribe for Dr. Clementeen Graham.  HPI: Pt is a 55 y/o female presenting w/ c/o R elbow pain x 2 months.  She locates her pain to R lateral epicondyle.  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  R elbow swelling:No R UE numbness/tinling: No Aggravating factors: Sleeping w/ her arm extended; gripping activities  Treatments tried: tennis elbow strap; Advil; ice  Pertinent review of Systems: No fevers or chills  Relevant historical information: Healthy and active. Hx shoulder impingement and achillies tendonitis.  Patient works as a pediatric occupational therapist.  Objective:    Vitals:   06/07/20 0853  BP: 100/62  Pulse: (!) 52  SpO2: 97%   General: Well Developed, well nourished, and in no acute distress.   MSK: Right elbow mild valgus appearance otherwise normal-appearing.  normal motion  normal strength. Tender palpation lateral epicondyle.  Pain with resisted wrist and finger extension. Pulses capillary fill and sensation are intact distally. No elbow laxity.   Lab and Radiology Results  Diagnostic Limited MSK Ultrasound of: Lateral elbow Lateral epicondyle visualized.  Small avulsion fleck superficial point of lateral epicondyle.  Small hypoechoic change mid substance insertion, extensor tendon.  Not definitive tear present.  No increased vascular activity. Possible tiny spur at humerus lateral elbow Impression: Lateral epicondylitis  X-ray images right elbow obtained today personally and independently interpreted Normal-appearing Await formal radiology review  Impression and Recommendations:    Assessment and Plan: 55 y.o. female with right lateral elbow pain..  Dominant finding is lateral epicondylitis.  She does not have the classic pain patterns for lateral epicondylitis.  It is possible that she has not epicondylitis and  also some dynamic instability in the elbow not appreciated on exam today.  Plan for home exercise program taught today in clinic by ATC.  Also use nitroglycerin patch protocol and recheck in 6 weeks.  If not improved would consider MRI arthrogram.  PDMP not reviewed this encounter. Orders Placed This Encounter  Procedures  . [place] LIMITED JOINT SPACE STRUCTURES UP RIGHT(NO LINKED CHARGES)    Order Specific Question:   Reason for Exam (SYMPTOM  OR DIAGNOSIS REQUIRED)    Answer:   R elbow pain    Order Specific Question:   Preferred imaging location?    Answer:   Adult nurse [facility]  . DG ELBOW COMPLETE RIGHT (3+VIEW)    Standing Status:   Future    Number of Occurrences:   1    Standing Expiration Date:   06/07/2021    Order Specific Question:   Reason for Exam (SYMPTOM  OR DIAGNOSIS REQUIRED)    Answer:   eval elbow pain    Order Specific Question:   Is patient pregnant?    Answer:   No    Order Specific Question:   Preferred imaging location?    Answer:   [facility]   Meds ordered this encounter  Medications  . nitroGLYCERIN (NITRODUR - DOSED IN MG/24 HR) 0.2 mg/hr patch    Sig: Apply 1/4 patch daily to tendon for tendonitis.    Dispense:  30 patch    Refill:  1    Discussed warning signs or symptoms. Please see discharge instructions. Patient expresses understanding.   The above documentation has been reviewed and is accurate and complete Clementeen Graham,  M.D.   

## 2020-06-07 ENCOUNTER — Ambulatory Visit: Payer: Self-pay

## 2020-06-07 ENCOUNTER — Ambulatory Visit: Payer: BC Managed Care – PPO | Admitting: Family Medicine

## 2020-06-07 ENCOUNTER — Other Ambulatory Visit: Payer: Self-pay

## 2020-06-07 ENCOUNTER — Encounter: Payer: Self-pay | Admitting: Family Medicine

## 2020-06-07 ENCOUNTER — Ambulatory Visit (INDEPENDENT_AMBULATORY_CARE_PROVIDER_SITE_OTHER): Payer: BC Managed Care – PPO

## 2020-06-07 VITALS — BP 100/62 | HR 52 | Ht 63.75 in | Wt 125.4 lb

## 2020-06-07 DIAGNOSIS — M25521 Pain in right elbow: Secondary | ICD-10-CM | POA: Diagnosis not present

## 2020-06-07 MED ORDER — NITROGLYCERIN 0.2 MG/HR TD PT24: MEDICATED_PATCH | TRANSDERMAL | 1 refills | Status: DC

## 2020-06-07 NOTE — Patient Instructions (Addendum)
Nice to meet you today.    Please perform the exercise program that we have prepared for you and gone over in detail on a daily basis.  In addition to the handout you were provided you can access your program through: [email or web address]   Your unique program code is: [email or web address]  Nitroglycerin Protocol   Apply 1/4 nitroglycerin patch to affected area daily.  Change position of patch within the affected area every 24 hours.  You may experience a headache during the first 1-2 weeks of using the patch, these should subside.  If you experience headaches after beginning nitroglycerin patch treatment, you may take your preferred over the counter pain reliever.  Another side effect of the nitroglycerin patch is skin irritation or rash related to patch adhesive.  Please notify our office if you develop more severe headaches or rash, and stop the patch.  Tendon healing with nitroglycerin patch may require 12 to 24 weeks depending on the extent of injury.  Men should not use if taking Viagra, Cialis, or Levitra.   Do not use if you have migraines or rosacea.    Recheck in 6 weeks.  If not better we can do more.

## 2020-06-10 NOTE — Progress Notes (Signed)
Right elbow x-ray looks normal to radiology

## 2020-06-27 ENCOUNTER — Ambulatory Visit: Payer: BC Managed Care – PPO | Admitting: Family Medicine

## 2020-07-16 ENCOUNTER — Other Ambulatory Visit: Payer: Self-pay | Admitting: Family Medicine

## 2020-07-16 DIAGNOSIS — Z1231 Encounter for screening mammogram for malignant neoplasm of breast: Secondary | ICD-10-CM

## 2020-07-17 NOTE — Progress Notes (Signed)
   I, [name], LAT, ATC, am serving as scribe for Dr. Clementeen Graham.  Alyssa Morton is a 55 y.o. female who presents to [facility] today for f/u of R elbow pain / lateral epicondylitis.  She was last seen by Dr. Denyse Amass on 06/07/20 and was shown a HEP for eccentric wrist extensor strengthening and wrist/forearm stretching.  She was also prescribed nitroglycerin patches.  Since her last visit, pt reports varying pain in her elbow from day to day. Pt has been not as compliant recently w/ her HEP, but has been using the nitro patches.   Additionally she notes right great toe MTP arthritis and lack of motion.  This is painful.  She has had evaluation by podiatry before and been told that she has arthritis and that ultimately she will need a fusion.  She notes this is painful with activity and she has been trying some range of motion exercises that help a little.  She wonders what else she can do while still remaining active.  Diagnostic testing: R elbow XR- 06/07/20  Pertinent review of systems: No fevers or chills  Relevant historical information: History of shoulder rotator cuff tendinopathy.   Exam:  BP 102/74 (BP Location: Right Arm, Patient Position: Sitting, Cuff Size: Normal)   Pulse 62   Ht 5' 3.75" (1.619 m)   SpO2 97%   BMI 21.69 kg/m  General: Well Developed, well nourished, and in no acute distress.   MSK: Right elbow normal-appearing normal motion. Mildly tender to palpation lateral epicondyle. Intact strength to resisted wrist and finger extension with a little bit of pain.  Right great toe enlarged bossing at the MTP joint. Decreased range of motion to dorsiflexion and plantarflexion. Intact strength.    Assessment and Plan: 55 y.o. female with right tennis elbow.  Improving with nitroglycerin patch protocol and home exercise program.  Recommend maximizing home exercise program and continuing nitroglycerin patch protocol for a total of 3  months.  Recommend Thera-Band flex bar.  Recheck back as needed for this.  Hallux rigidus.  Discussed options.  Recommend carbon fiber turf toe insole.  Recheck for dedicated visit for this if needed.    Discussed warning signs or symptoms. Please see discharge instructions. Patient expresses understanding.   The above documentation has been reviewed and is accurate and complete Clementeen Graham, M.D.

## 2020-07-18 ENCOUNTER — Ambulatory Visit: Payer: BC Managed Care – PPO | Admitting: Family Medicine

## 2020-07-18 ENCOUNTER — Other Ambulatory Visit: Payer: Self-pay

## 2020-07-18 DIAGNOSIS — M2021 Hallux rigidus, right foot: Secondary | ICD-10-CM | POA: Diagnosis not present

## 2020-07-18 DIAGNOSIS — Z8739 Personal history of other diseases of the musculoskeletal system and connective tissue: Secondary | ICD-10-CM | POA: Insufficient documentation

## 2020-07-18 NOTE — Patient Instructions (Signed)
Thank you for coming in today.   Continue the home exercises.    Typical course of nitroglycerine patches is 3 months.  Let me know if your need a refill.   The toe look like Halux Regidus (big toe arthritis).   Try a turf toe insole (steel or carbon fiber).   Recheck as needed.   Get a Steel Turf Toe insole.  Do a Microbiologist for [employer]

## 2020-08-13 ENCOUNTER — Telehealth: Payer: Self-pay | Admitting: Family Medicine

## 2020-08-13 DIAGNOSIS — D225 Melanocytic nevi of trunk: Secondary | ICD-10-CM | POA: Diagnosis not present

## 2020-08-13 DIAGNOSIS — Z1283 Encounter for screening for malignant neoplasm of skin: Secondary | ICD-10-CM | POA: Diagnosis not present

## 2020-08-13 NOTE — Telephone Encounter (Signed)
Pt is coming for TB Skin test on tomorrow for employment. Can an order be placed for her. She will also need a form filled out to go with it as well. I advised her to bring in form.

## 2020-08-14 ENCOUNTER — Ambulatory Visit (INDEPENDENT_AMBULATORY_CARE_PROVIDER_SITE_OTHER): Payer: BC Managed Care – PPO

## 2020-08-14 ENCOUNTER — Other Ambulatory Visit: Payer: Self-pay

## 2020-08-14 DIAGNOSIS — Z111 Encounter for screening for respiratory tuberculosis: Secondary | ICD-10-CM

## 2020-08-14 DIAGNOSIS — H524 Presbyopia: Secondary | ICD-10-CM | POA: Diagnosis not present

## 2020-08-14 NOTE — Telephone Encounter (Signed)
Patient had TB test done today 08/14/20. Will return on Friday 08/16/20 to have TB skin test read///appt. Schedule with PCP to be abe to complete form.

## 2020-08-14 NOTE — Progress Notes (Signed)
PPD Placement note Alyssa Morton, 54 y.o. female is here today for placement of PPD test Reason for PPD test: Employment Pt taken PPD test before: yes Verified in allergy area and with patient that they are not allergic to the products PPD is made of (Phenol or Tween). Yes Is patient taking any oral or IV steroid medication now or have they taken it in the last month? NO Has the patient ever received the BCG vaccine?: no Has the patient been in recent contact with anyone known or suspected of having active TB disease?: no      Date of exposure (if applicable):       Name of person they were exposed to (if applicable):  Patient's Country of origin?:  O: Alert and oriented in NAD. P:  PPD placed on 08/14/2020.  Patient advised to return for reading within 48-72 hours. Pt will come in on Friday for a OV with [facility]

## 2020-08-14 NOTE — Telephone Encounter (Signed)
Hopefully to have it read and not administered?

## 2020-08-16 ENCOUNTER — Other Ambulatory Visit: Payer: Self-pay

## 2020-08-16 ENCOUNTER — Encounter: Payer: Self-pay | Admitting: Family Medicine

## 2020-08-16 ENCOUNTER — Ambulatory Visit: Payer: BC Managed Care – PPO | Admitting: Family Medicine

## 2020-08-16 VITALS — BP 102/68 | HR 59 | Temp 97.8°F | Ht 63.0 in

## 2020-08-16 DIAGNOSIS — E7800 Pure hypercholesterolemia, unspecified: Secondary | ICD-10-CM | POA: Diagnosis not present

## 2020-08-16 DIAGNOSIS — Z0289 Encounter for other administrative examinations: Secondary | ICD-10-CM | POA: Diagnosis not present

## 2020-08-16 LAB — TB SKIN TEST
Induration: 0 mm
TB Skin Test: NEGATIVE

## 2020-08-16 NOTE — Progress Notes (Signed)
Chief Complaint  Patient presents with   complete health form    Subjective: Patient is a 55 y.o. female here for completion of a health form.  Patient had a TB skin test on Wednesday which is negative today.  She is starting a new job and needs a health form filled out.  She exercises routinely.  She is up-to-date with most of her shots.  She has no concerns with hearing or vision.  She has no pain out of the ordinary.  She has not had the shingles vaccine.  Past Medical History:  Diagnosis Date   Anorexia    IN HIGH SCHOOL   Kidney stones    HX OF    Objective: BP 102/68   Pulse (!) 59   Temp 97.8 F (36.6 C) (Oral)   Ht 5\' 3"  (1.6 m)   SpO2 97%   BMI 22.21 kg/m  General: Awake, appears stated age HEENT: Ears are patent, TMs negative, EOMi Neuro: Gait is normal, 5/5 strength throughout Heart: Regular rhythm, bradycardic Lungs: CTAB, no rales, wheezes or rhonchi. No accessory muscle use Psych: Age appropriate judgment and insight, normal affect and mood  Assessment and Plan: Encounter for completion of form with patient  Pure hypercholesterolemia - Plan: Lipid panel  Form completed today.  There are no medical contraindications to her performing her job.  She is up-to-date with her essential immunizations, she will plan accordingly for the shingles vaccination.  Recommended flu shot in October.  Recommended COVID vaccination boosting with the updated versions to, in September. She had slightly elevated LDL at her last physical, she would like to repeat this at some point.  I will place the order and she can schedule at her convenience. I will see her in a year for her physical or as needed. The patient voiced understanding and agreement to the plan.  October Peletier, DO 08/16/20  11:59 AM

## 2020-08-16 NOTE — Patient Instructions (Signed)
I recommend getting the flu shot in mid October. This suggestion would change if the CDC comes out with a different recommendation.   The new Shingrix vaccine (for shingles) is a 2 shot series. It can make people feel low energy, achy and almost like they have the flu for 48 hours after injection. Please plan accordingly when deciding on when to get this shot. Call our office for a nurse visit appointment to get this. The second shot of the series is less severe regarding the side effects, but it still lasts 48 hours.   Keep the diet clean and stay active.  Let us know if you need anything.

## 2020-09-13 ENCOUNTER — Other Ambulatory Visit: Payer: Self-pay

## 2020-09-13 ENCOUNTER — Ambulatory Visit
Admission: RE | Admit: 2020-09-13 | Discharge: 2020-09-13 | Disposition: A | Discharge status: Home / Self Care | Payer: BC Managed Care – PPO | Source: Ambulatory Visit | Attending: Family Medicine | Admitting: Family Medicine

## 2020-09-13 DIAGNOSIS — Z1231 Encounter for screening mammogram for malignant neoplasm of breast: Secondary | ICD-10-CM | POA: Diagnosis not present

## 2020-12-25 DIAGNOSIS — J029 Acute pharyngitis, unspecified: Secondary | ICD-10-CM | POA: Diagnosis not present

## 2020-12-25 DIAGNOSIS — Z20822 Contact with and (suspected) exposure to covid-19: Secondary | ICD-10-CM | POA: Diagnosis not present

## 2021-06-09 ENCOUNTER — Telehealth: Payer: Self-pay | Admitting: Family Medicine

## 2021-06-09 ENCOUNTER — Other Ambulatory Visit: Payer: Self-pay | Admitting: Family Medicine

## 2021-06-09 DIAGNOSIS — Z13820 Encounter for screening for osteoporosis: Secondary | ICD-10-CM

## 2021-06-09 NOTE — Telephone Encounter (Signed)
We can try to order but insurance may not cover.

## 2021-06-09 NOTE — Telephone Encounter (Signed)
Order in [place] the patient left msg. To call back.

## 2021-06-09 NOTE — Telephone Encounter (Signed)
Pt called stating her sister and mother were diagnosed with osteoporosis and she was looking to have a bone density test done if possible.

## 2021-06-10 NOTE — Telephone Encounter (Signed)
Caller Name Birtha Hatler Caller Phone Number [phone] Call Type Message Only Information Provided Reason for Call Returning a Call from the Office Initial Comment Caller states she is returning a call that she missed. Additional Comment Office hours provided. Caller is unsure what the call is about. Disp. Time Disposition Final User 06/09/2021 5:14:05 PM General Information Provided Yes [place], [place] Call Closed By: Rochele Pages Transaction Date/Time: 06/09/2021 5:10:22 PM (ET)

## 2021-06-10 NOTE — Telephone Encounter (Signed)
Patient informed of all information

## 2021-06-11 ENCOUNTER — Telehealth (HOSPITAL_BASED_OUTPATIENT_CLINIC_OR_DEPARTMENT_OTHER): Payer: Self-pay

## 2021-06-17 ENCOUNTER — Other Ambulatory Visit (HOSPITAL_BASED_OUTPATIENT_CLINIC_OR_DEPARTMENT_OTHER): Payer: BC Managed Care – PPO

## 2021-06-24 ENCOUNTER — Ambulatory Visit (HOSPITAL_BASED_OUTPATIENT_CLINIC_OR_DEPARTMENT_OTHER)
Admission: RE | Admit: 2021-06-24 | Discharge: 2021-06-24 | Disposition: A | Discharge status: Home / Self Care | Payer: BC Managed Care – PPO | Source: Ambulatory Visit | Attending: Family Medicine | Admitting: Family Medicine

## 2021-06-24 DIAGNOSIS — Z780 Asymptomatic menopausal state: Secondary | ICD-10-CM | POA: Diagnosis not present

## 2021-06-24 DIAGNOSIS — Z13820 Encounter for screening for osteoporosis: Secondary | ICD-10-CM | POA: Diagnosis not present

## 2021-06-24 DIAGNOSIS — M8589 Other specified disorders of bone density and structure, multiple sites: Secondary | ICD-10-CM | POA: Diagnosis not present

## 2021-06-27 DIAGNOSIS — L821 Other seborrheic keratosis: Secondary | ICD-10-CM | POA: Diagnosis not present

## 2021-06-27 DIAGNOSIS — Z1283 Encounter for screening for malignant neoplasm of skin: Secondary | ICD-10-CM | POA: Diagnosis not present

## 2021-07-15 ENCOUNTER — Encounter: Payer: Self-pay | Admitting: Family Medicine

## 2021-07-15 ENCOUNTER — Ambulatory Visit (INDEPENDENT_AMBULATORY_CARE_PROVIDER_SITE_OTHER): Payer: BC Managed Care – PPO | Admitting: Family Medicine

## 2021-07-15 VITALS — BP 112/68 | HR 58 | Temp 98.0°F | Ht 64.0 in

## 2021-07-15 DIAGNOSIS — Z0000 Encounter for general adult medical examination without abnormal findings: Secondary | ICD-10-CM | POA: Diagnosis not present

## 2021-07-15 DIAGNOSIS — M8580 Other specified disorders of bone density and structure, unspecified site: Secondary | ICD-10-CM | POA: Diagnosis not present

## 2021-07-15 NOTE — Patient Instructions (Addendum)
Give [place] 2-3 business days to get the results of your labs back.   Keep the diet clean and stay active.  The Shingrix vaccine (for shingles) is a 2 shot series spaced 2-6 months apart. It can make people feel low energy, achy and almost like they have the flu for 48 hours after injection. 1/5 people can have nausea and/or vomiting. Please plan accordingly when deciding on when to get this shot. Call our office for a nurse visit appointment to get this. The second shot of the series is less severe regarding the side effects, but it still lasts 48 hours.   Please get me a copy of your advanced directive form at your convenience.   OK to use Debrox (peroxide) in the ear to loosen up wax. Also recommend using a bulb syringe (for removing boogers from baby's noses) to flush through warm water and vinegar (3-4:1 ratio). An alternative, though more expensive, is an elephant ear washer wax removal kit. Do not use Q-tips as this can impact wax further.  Foods that may reduce pain: 1) Ginger 2) Blueberries 3) Salmon 4) Pumpkin seeds 5) dark chocolate 6) turmeric 7) tart cherries 8) virgin olive oil 9) chilli peppers 10) mint 11) krill oil   Let us know if you need anything.

## 2021-07-15 NOTE — Progress Notes (Signed)
Chief Complaint  Patient presents with   Annual Exam     Well Woman Alyssa Morton is here for a complete physical.   Her last physical was >1 year ago.  Current diet: in general, a "healthy" diet. Current exercise: tennis, lifting weights, running, walking  Weight is stable and she denies fatigue out of ordinary. Seatbelt? Yes Advanced directive? Yes  Health Maintenance Pap/HPV- Yes Mammogram- Yes Colon cancer screening-Yes Shingrix- No Tetanus- Yes Hep C screening- Yes HIV screening- Yes  Past Medical History:  Diagnosis Date   Anorexia    IN HIGH SCHOOL   Kidney stones    HX OF     Past Surgical History:  Procedure Laterality Date   G2 P2      Medications  Takes no meds routinely.    Allergies No Known Allergies  Review of Systems: Constitutional:  no unexpected weight changes Eye:  no recent significant change in vision Ear/Nose/Mouth/Throat:  Ears:  no recent change in hearing Nose/Mouth/Throat:  no complaints of nasal congestion, no sore throat Cardiovascular: no chest pain Respiratory:  no shortness of breath Gastrointestinal:  no abdominal pain, no change in bowel habits GU:  Female: negative for dysuria or pelvic pain Musculoskeletal/Extremities:  no pain of the joints Integumentary (Skin/Breast):  no abnormal skin lesions reported Neurologic:  no headaches Endocrine:  denies fatigue  Exam BP 112/68   Pulse (!) 58   Temp 98 F (36.7 C) (Oral)   Ht 5\' 4"  (1.626 m)   SpO2 98%   BMI 21.52 kg/m  General:  well developed, well nourished, in no apparent distress Skin:  no significant moles, warts, or growths Head:  no masses, lesions, or tenderness Eyes:  pupils equal and round, sclera anicteric without injection Ears:  canals without lesions, TMs shiny without retraction, no obvious effusion, no erythema Nose:  nares patent, septum midline, mucosa normal, and no drainage or sinus tenderness Throat/Pharynx:  lips and gingiva without lesion;  tongue and uvula midline; non-inflamed pharynx; no exudates or postnasal drainage Neck: neck supple without adenopathy, thyromegaly, or masses Lungs:  clear to auscultation, breath sounds equal bilaterally, no respiratory distress Cardio:  regular rate and rhythm, no LE edema Abdomen:  abdomen soft, nontender; bowel sounds normal; no masses or organomegaly Genital: Defer to GYN Musculoskeletal:  symmetrical muscle groups noted without atrophy or deformity Extremities:  no clubbing, cyanosis, or edema, no deformities, no skin discoloration Neuro:  gait normal; deep tendon reflexes normal and symmetric Psych: well oriented with normal range of affect and appropriate judgment/insight  Assessment and Plan  Well adult exam - Plan: CBC, Comprehensive metabolic panel, Lipid panel  Osteopenia, unspecified location - Plan: VITAMIN D 25 Hydroxy (Vit-D Deficiency, Fractures)   Well 56 y.o. female. Counseled on diet and exercise. Advanced directive form requested today. Shingrix rec'd.  Discussed Ca/Vit D supplementation.   Other orders as above. Follow up in 1 yr for CPE or prn. The patient voiced understanding and agreement to the plan.  [address] [place], DO 07/15/21 2:16 PM

## 2021-08-15 ENCOUNTER — Other Ambulatory Visit: Payer: Self-pay

## 2021-10-27 ENCOUNTER — Other Ambulatory Visit: Payer: Self-pay | Admitting: Family Medicine

## 2021-10-27 DIAGNOSIS — Z1231 Encounter for screening mammogram for malignant neoplasm of breast: Secondary | ICD-10-CM

## 2021-12-22 ENCOUNTER — Ambulatory Visit: Payer: Self-pay

## 2022-01-30 ENCOUNTER — Other Ambulatory Visit (INDEPENDENT_AMBULATORY_CARE_PROVIDER_SITE_OTHER): Payer: BC Managed Care – PPO

## 2022-01-30 DIAGNOSIS — M8580 Other specified disorders of bone density and structure, unspecified site: Secondary | ICD-10-CM | POA: Diagnosis not present

## 2022-01-30 DIAGNOSIS — Z0000 Encounter for general adult medical examination without abnormal findings: Secondary | ICD-10-CM

## 2022-01-30 LAB — COMPREHENSIVE METABOLIC PANEL
ALT: 12 U/L (ref 0–35)
AST: 22 U/L (ref 0–37)
Albumin: 4.3 g/dL (ref 3.5–5.2)
Alkaline Phosphatase: 89 U/L (ref 39–117)
BUN: 16 mg/dL (ref 6–23)
CO2: 30 mEq/L (ref 19–32)
Calcium: 9.4 mg/dL (ref 8.4–10.5)
Chloride: 105 mEq/L (ref 96–112)
Creatinine, Ser: 0.79 mg/dL (ref 0.40–1.20)
GFR: 84.02 mL/min (ref >60.00)
Glucose, Bld: 87 mg/dL (ref 70–99)
Potassium: 5 mEq/L (ref 3.5–5.1)
Sodium: 142 mEq/L (ref 135–145)
Total Bilirubin: 0.5 mg/dL (ref 0.2–1.2)
Total Protein: 6.6 g/dL (ref 6.0–8.3)

## 2022-01-30 LAB — LIPID PANEL
Cholesterol: 196 mg/dL (ref 0–200)
HDL: 77 mg/dL (ref >39.00)
LDL Cholesterol: 108 mg/dL — ABNORMAL HIGH (ref 0–99)
NonHDL: 118.91
Total CHOL/HDL Ratio: 3
Triglycerides: 57 mg/dL (ref 0.0–149.0)
VLDL: 11.4 mg/dL (ref 0.0–40.0)

## 2022-01-30 LAB — CBC
HCT: 42.3 % (ref 36.0–46.0)
Hemoglobin: 14.5 g/dL (ref 12.0–15.0)
MCHC: 34.4 g/dL (ref 30.0–36.0)
MCV: 89.8 fl (ref 78.0–100.0)
Platelets: 236 10*3/uL (ref 150.0–400.0)
RBC: 4.72 Mil/uL (ref 3.87–5.11)
RDW: 13.1 % (ref 11.5–15.5)
WBC: 3.4 10*3/uL — ABNORMAL LOW (ref 4.0–10.5)

## 2022-01-30 LAB — VITAMIN D 25 HYDROXY (VIT D DEFICIENCY, FRACTURES): VITD: 25.22 ng/mL — ABNORMAL LOW (ref 30.00–100.00)

## 2022-01-31 ENCOUNTER — Other Ambulatory Visit: Payer: Self-pay | Admitting: Family Medicine

## 2022-01-31 MED ORDER — VITAMIN D (ERGOCALCIFEROL) 1.25 MG (50000 UNIT) PO CAPS: 50000.0000 [IU] | ORAL_CAPSULE | ORAL | 0 refills | Status: DC

## 2022-02-02 ENCOUNTER — Ambulatory Visit: Payer: BC Managed Care – PPO | Admitting: Family Medicine

## 2022-02-02 ENCOUNTER — Encounter: Payer: Self-pay | Admitting: Family Medicine

## 2022-02-02 VITALS — BP 111/64 | HR 62 | Temp 98.1°F | Ht 64.0 in

## 2022-02-02 DIAGNOSIS — H6121 Impacted cerumen, right ear: Secondary | ICD-10-CM | POA: Diagnosis not present

## 2022-02-02 DIAGNOSIS — E7800 Pure hypercholesterolemia, unspecified: Secondary | ICD-10-CM | POA: Diagnosis not present

## 2022-02-02 NOTE — Patient Instructions (Addendum)
I would consider a coronary artery calcium score (CACS) to look for plaque in your vessels.   Keep the diet clean and stay active.  Good luck in the future!  Let us know if you need anything.

## 2022-02-02 NOTE — Progress Notes (Signed)
Chief Complaint  Patient presents with   Ear Fullness   Results    Subjective: Patient is a 57 y.o. female here for cerumen impaction and discussion of results.  Patient was seen around 6 months ago for her physical and was found to have cerumen building in her right ear.  Lately, she has noticed more difficulty hearing and fullness.  She does not use Q-tips.  There is no pain, fevers, or drainage.  She tried flushing her ear out without relief.  Here to discuss her lab results.  LDL was mildly elevated at 108, improved from last year of 115.  Her HDL was 77.  10-year CVD risk is less than 7.5%.  She eats healthy and exercises routinely.  Past Medical History:  Diagnosis Date   Anorexia    IN HIGH SCHOOL   Kidney stones    HX OF    Objective: BP 111/64 (BP Location: Left Arm, Patient Position: Sitting, Cuff Size: Normal)   Pulse 62   Temp 98.1 F (36.7 C) (Oral)   Ht 5\' 4"  (1.626 m)   SpO2 98%   BMI 21.52 kg/m  General: Awake, appears stated age HENT: Canal patent on the left, TM negative; 100% obstructed with cerumen on the right; after removal TM is unremarkable Lungs: CTAB, no rales, wheezes or rhonchi. No accessory muscle use Psych: Age appropriate judgment and insight, normal affect and mood  Procedure note: Cerumen removal instrumentation Verbal consent obtained. I used a combination of alligator forceps and a curette with a light source to remove cerumen from her right canal Pt reported immediate improvement. Pt tolerated procedure well overall. There were no immediate complications noted.  Assessment and Plan: Impacted cerumen of right ear  Pure hypercholesterolemia  Successful removal today with both irrigation and instrumentation.  Home care discussed.  She may need to do recurrent irrigations at home. She is very healthy overall with her diet and exercising.  We had a discussion about 10-year CVD risk in addition to coronary artery calcium scoring.  She is  moving a little over a month to [place].  She will hold off on any decisions until she gets down there. The patient voiced understanding and agreement to the plan.  I spent 22 min with the pt discussing the above plans in addition to reviewing her char ton the same day of the visit. The 22 minutes was exclusive of the time I spent with the wax removal procedure.   [place], DO 02/02/22  4:42 PM

## 2022-02-12 ENCOUNTER — Ambulatory Visit
Admission: RE | Admit: 2022-02-12 | Discharge: 2022-02-12 | Disposition: A | Discharge status: Home / Self Care | Payer: BC Managed Care – PPO | Source: Ambulatory Visit | Attending: Family Medicine | Admitting: Family Medicine

## 2022-02-12 DIAGNOSIS — Z1231 Encounter for screening mammogram for malignant neoplasm of breast: Secondary | ICD-10-CM

## 2022-02-17 DIAGNOSIS — Z01411 Encounter for gynecological examination (general) (routine) with abnormal findings: Secondary | ICD-10-CM | POA: Diagnosis not present

## 2022-02-17 DIAGNOSIS — N905 Atrophy of vulva: Secondary | ICD-10-CM | POA: Diagnosis not present

## 2022-02-17 DIAGNOSIS — N9411 Superficial (introital) dyspareunia: Secondary | ICD-10-CM | POA: Diagnosis not present

## 2022-02-17 DIAGNOSIS — N952 Postmenopausal atrophic vaginitis: Secondary | ICD-10-CM | POA: Diagnosis not present

## 2022-03-24 ENCOUNTER — Telehealth: Payer: Self-pay | Admitting: Family Medicine

## 2022-03-24 MED ORDER — VITAMIN D (ERGOCALCIFEROL) 1.25 MG (50000 UNIT) PO CAPS: 50000.0000 [IU] | ORAL_CAPSULE | ORAL | 0 refills | Status: AC

## 2022-03-24 NOTE — Telephone Encounter (Signed)
Pt is moving to [place] and realized that her vitamin D prescription was put in storage and so she does not have it. Pt wants to know if she can get another refill of the medication sent to [facility], [place] [address] // [phone]  Please call patient to advise if this is possible.

## 2022-03-24 NOTE — Telephone Encounter (Signed)
Sent in #12 No more refills available needs appt.

## 2022-03-24 NOTE — Addendum Note (Signed)
Addended by: Sharon Seller B on: 03/24/2022 09:54 AM   Modules accepted: Orders

## 2022-12-03 IMAGING — MG MM DIGITAL SCREENING BILAT W/ TOMO AND CAD
8 series · 9 of 24 positions shown · non-contrast
Comparison: Previous exam(s).

CLINICAL DATA: Screening.

EXAM:
DIGITAL SCREENING BILATERAL MAMMOGRAM WITH TOMOSYNTHESIS AND CAD
TECHNIQUE: Bilateral screening digital craniocaudal and mediolateral oblique
mammograms were obtained. Bilateral screening digital breast
tomosynthesis was performed. The images were evaluated with
computer-aided detection.

[L MLO synth-2D]
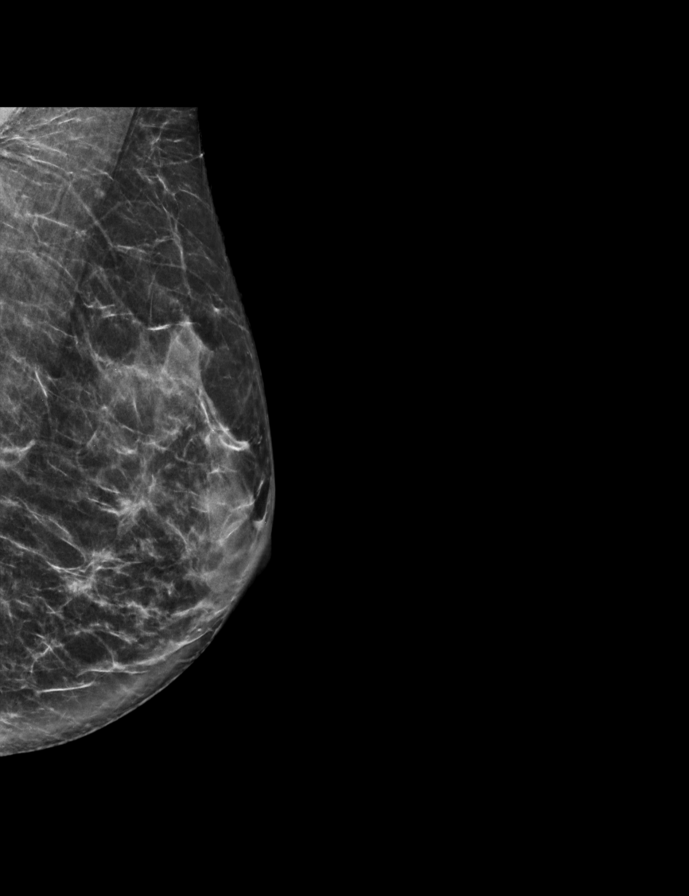

[L CC synth-2D]
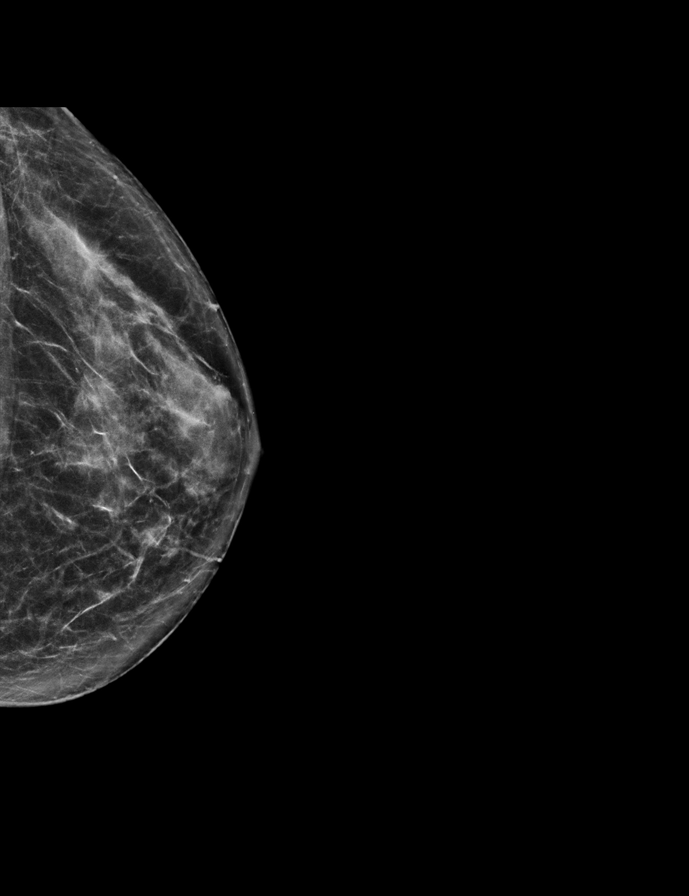

[R MLO synth-2D]
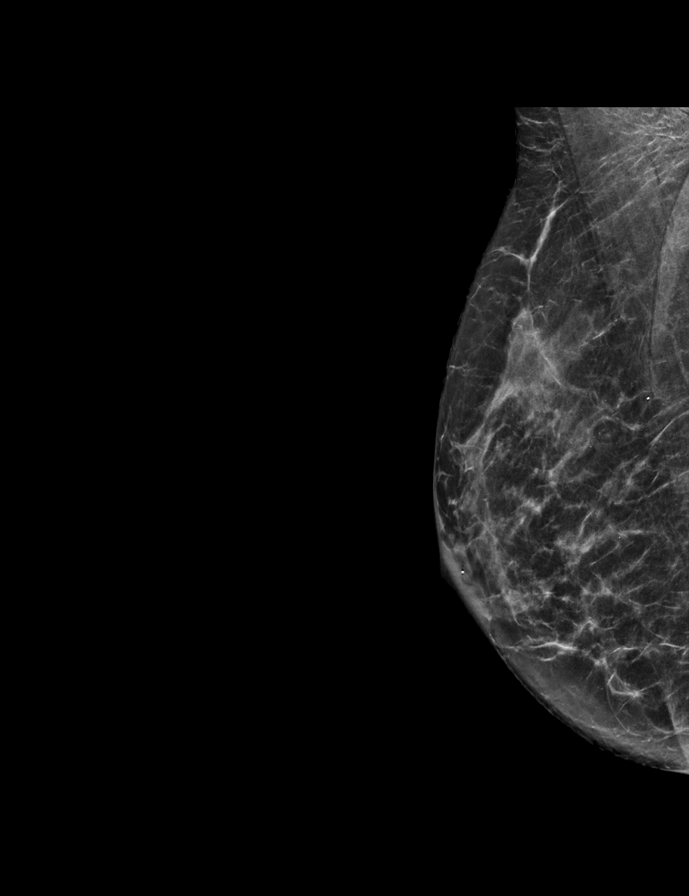

[R CC synth-2D]
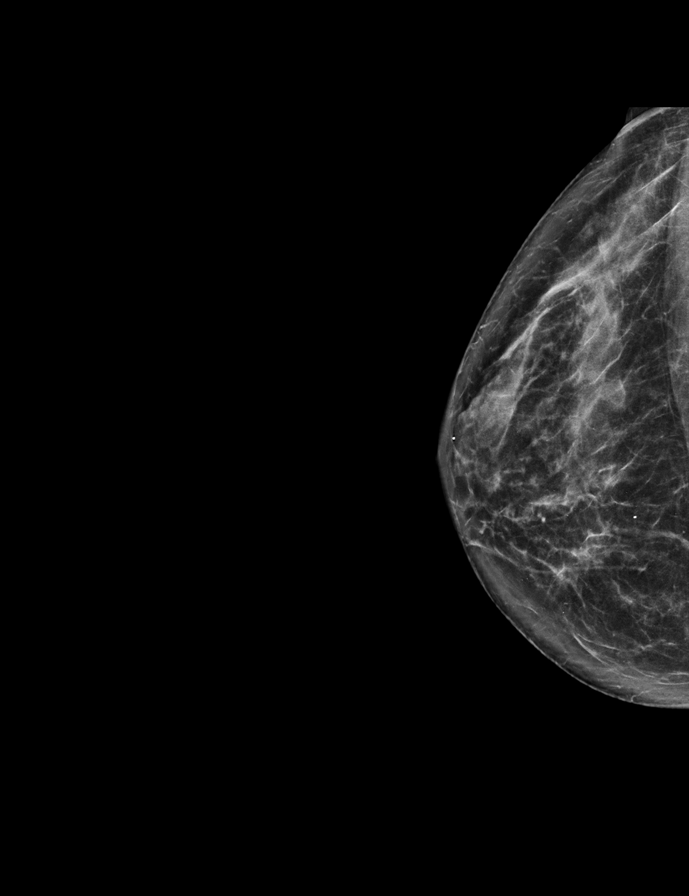

[L CC tomo · 2 of 60 frames shown]
[frame 20/60]
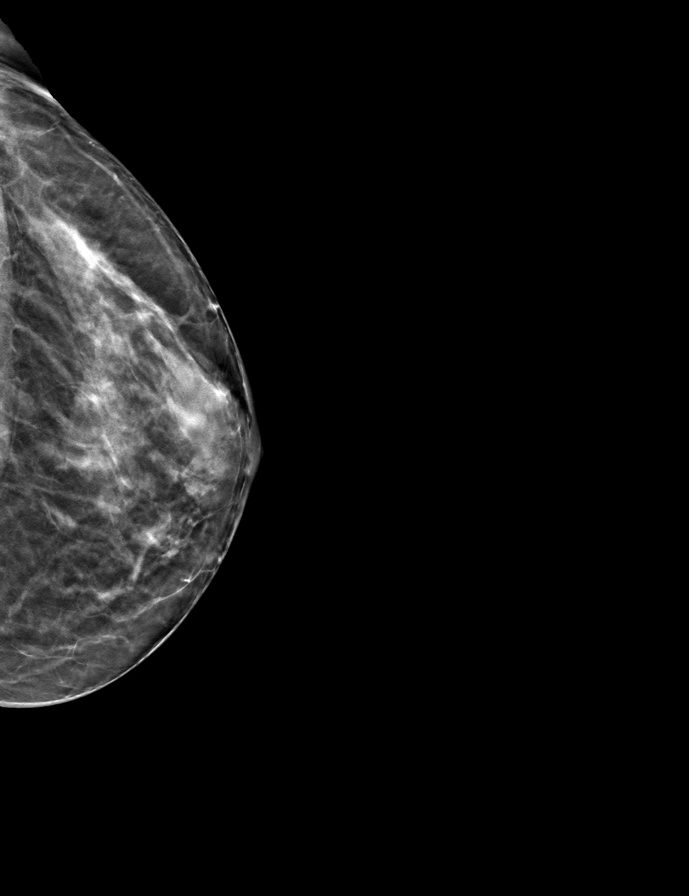
[frame 31/60]
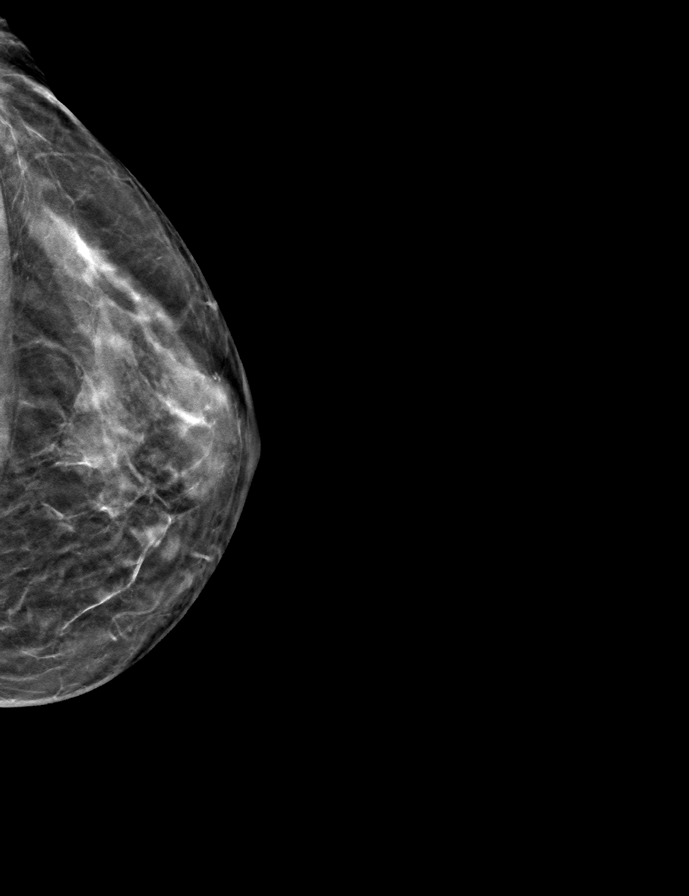

[R MLO tomo · tomo slice 29/57.0]
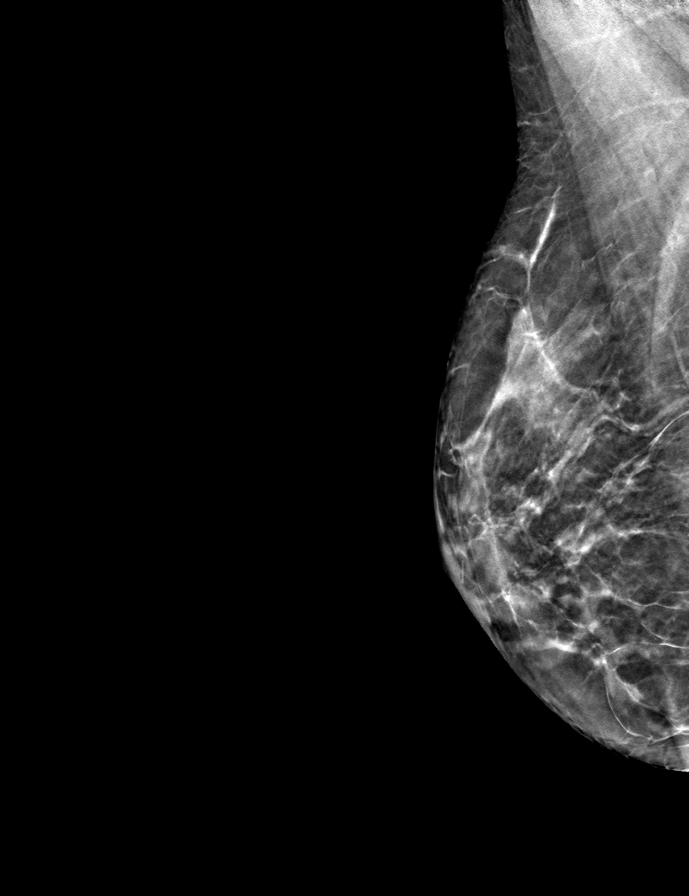

[L MLO tomo · tomo slice 30/59.0]
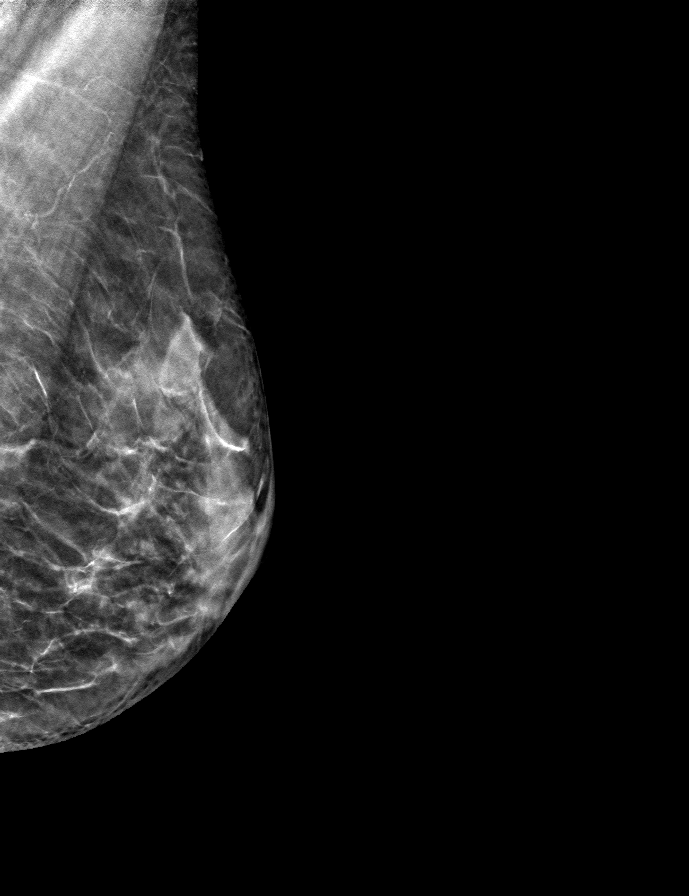

[R CC tomo · tomo slice 30/59.0]
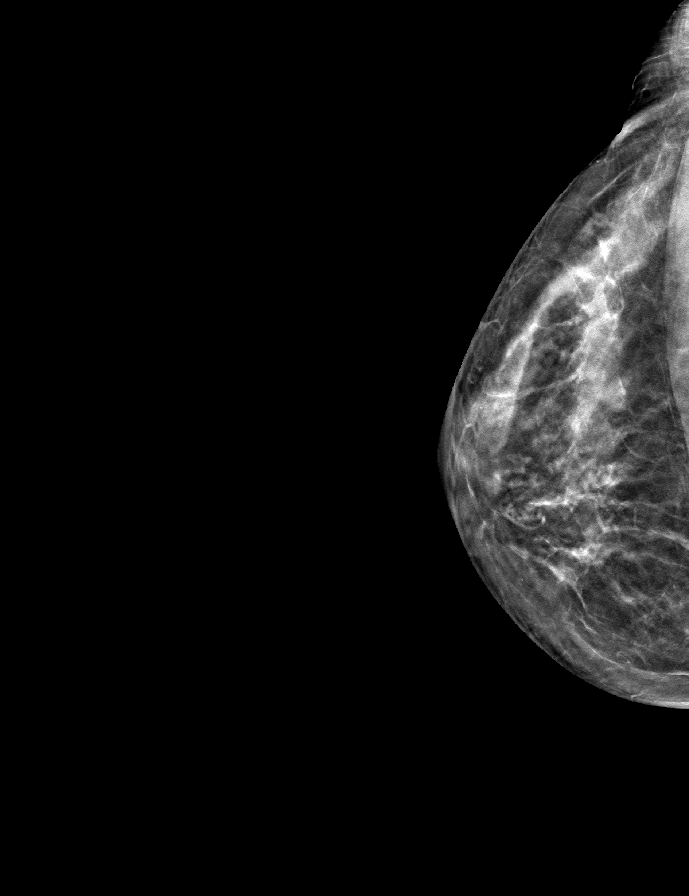

[9 of 24 positions shown; findings below may reference images not displayed]

ACR Breast Density Category c: The breast tissue is heterogeneously
dense, which may obscure small masses.
FINDINGS: There are no findings suspicious for malignancy.
IMPRESSION: No mammographic evidence of malignancy. A result letter of this
screening mammogram will be mailed directly to the patient.

RECOMMENDATION:
Screening mammogram in one year. (Code:Q3-W-BC3)

BI-RADS CATEGORY  1: Negative.

## 2023-05-24 ENCOUNTER — Other Ambulatory Visit: Payer: Self-pay

## 2023-05-24 DIAGNOSIS — M8580 Other specified disorders of bone density and structure, unspecified site: Secondary | ICD-10-CM

## 2023-06-30 ENCOUNTER — Telehealth (HOSPITAL_BASED_OUTPATIENT_CLINIC_OR_DEPARTMENT_OTHER): Payer: Self-pay
